# Patient Record
Sex: Male | Born: 1955 | Smoking: Never smoker
Health system: Northeastern US, Academic
[De-identification: ages and names within clinical notes are randomized; demographics above are authoritative.]

## PROBLEM LIST (undated history)

## (undated) ENCOUNTER — Emergency Department (HOSPITAL_BASED_OUTPATIENT_CLINIC_OR_DEPARTMENT_OTHER): Admission: EM | Payer: Medicare Other

## (undated) DIAGNOSIS — E785 Hyperlipidemia, unspecified: Secondary | ICD-10-CM

## (undated) DIAGNOSIS — I219 Acute myocardial infarction, unspecified: Secondary | ICD-10-CM

## (undated) DIAGNOSIS — C9200 Acute myeloblastic leukemia, not having achieved remission: Secondary | ICD-10-CM

## (undated) DIAGNOSIS — I509 Heart failure, unspecified: Secondary | ICD-10-CM

## (undated) DIAGNOSIS — I10 Essential (primary) hypertension: Secondary | ICD-10-CM

## (undated) HISTORY — DX: Hyperlipidemia, unspecified: E78.5

## (undated) HISTORY — DX: Acute myeloblastic leukemia, not having achieved remission: C92.00

## (undated) HISTORY — DX: Essential (primary) hypertension: I10

## (undated) HISTORY — DX: Heart failure, unspecified: I50.9

## (undated) HISTORY — DX: Acute myocardial infarction, unspecified: I21.9

## (undated) HISTORY — PX: NO PAST SURGERIES: SHX2092

---

## 2000-03-06 ENCOUNTER — Ambulatory Visit (HOSPITAL_COMMUNITY): Admission: RE | Admit: 2000-03-06 | Discharge: 2000-03-06 | Payer: Self-pay | Admitting: Urology

## 2000-03-09 ENCOUNTER — Emergency Department (HOSPITAL_COMMUNITY): Admission: EM | Admit: 2000-03-09 | Discharge: 2000-03-09 | Payer: Self-pay | Admitting: Urology

## 2000-03-09 ENCOUNTER — Encounter: Payer: Self-pay | Admitting: Urology

## 2000-03-12 ENCOUNTER — Encounter: Admission: RE | Admit: 2000-03-12 | Discharge: 2000-03-12 | Payer: Self-pay | Admitting: Urology

## 2000-03-12 ENCOUNTER — Encounter: Payer: Self-pay | Admitting: Urology

## 2001-02-20 ENCOUNTER — Emergency Department (HOSPITAL_COMMUNITY): Admission: EM | Admit: 2001-02-20 | Discharge: 2001-02-20 | Payer: Self-pay | Admitting: *Deleted

## 2011-11-19 DEATH — deceased

## 2014-06-16 ENCOUNTER — Encounter (HOSPITAL_BASED_OUTPATIENT_CLINIC_OR_DEPARTMENT_OTHER): Payer: Self-pay | Admitting: Emergency Medicine

## 2014-06-16 ENCOUNTER — Emergency Department (HOSPITAL_BASED_OUTPATIENT_CLINIC_OR_DEPARTMENT_OTHER)
Admission: EM | Admit: 2014-06-16 | Discharge: 2014-06-16 | Disposition: A | Discharge status: Home / Self Care | Payer: 59 | Attending: Emergency Medicine | Admitting: Emergency Medicine

## 2014-06-16 DIAGNOSIS — Y929 Unspecified place or not applicable: Secondary | ICD-10-CM | POA: Diagnosis not present

## 2014-06-16 DIAGNOSIS — W458XXA Other foreign body or object entering through skin, initial encounter: Secondary | ICD-10-CM | POA: Insufficient documentation

## 2014-06-16 DIAGNOSIS — Y939 Activity, unspecified: Secondary | ICD-10-CM | POA: Diagnosis not present

## 2014-06-16 DIAGNOSIS — S61214A Laceration without foreign body of right ring finger without damage to nail, initial encounter: Secondary | ICD-10-CM

## 2014-06-16 DIAGNOSIS — Z23 Encounter for immunization: Secondary | ICD-10-CM | POA: Diagnosis not present

## 2014-06-16 MED ORDER — LIDOCAINE HCL 2 % IJ SOLN
5.0000 mL | Freq: Once | INTRAMUSCULAR | Status: AC
  Administered 2014-06-16: 100 mg via INTRADERMAL
  Filled 2014-06-16: qty 20

## 2014-06-16 MED ORDER — TETANUS-DIPHTH-ACELL PERTUSSIS 5-2.5-18.5 LF-MCG/0.5 IM SUSP
0.5000 mL | Freq: Once | INTRAMUSCULAR | Status: AC
  Administered 2014-06-16: 0.5 mL via INTRAMUSCULAR
  Filled 2014-06-16: qty 0.5

## 2014-06-16 NOTE — Discharge Instructions (Signed)

## 2014-06-16 NOTE — ED Provider Notes (Signed)
CSN: [id number]     Arrival date & time 06/16/14  1649 History   First MD Initiated Contact with Patient 06/16/14 1655     Chief Complaint  Patient presents with  . Extremity Laceration     (Consider location/radiation/quality/duration/timing/severity/associated sxs/prior Treatment) Patient is a 58 y.o. male presenting with skin laceration.  Laceration Location:  Hand Hand laceration location:  R finger Length (cm):  2 Depth:  Cutaneous Quality: straight   Bleeding: controlled   Time since incident:  3 hours Injury mechanism: caught finger on a saw that he was putting down. Pain details:    Quality:  Sharp   Severity:  Mild   Timing:  Constant Foreign body present:  No foreign bodies Worsened by:  Movement Tetanus status:  Unknown   History reviewed. No pertinent past medical history. History reviewed. No pertinent past surgical history. No family history on file. History  Substance Use Topics  . Smoking status: Never Smoker   . Smokeless tobacco: Not on file  . Alcohol Use: No    Review of Systems  All other systems reviewed and are negative.     Allergies  Review of patient's allergies indicates no known allergies.  Home Medications   Prior to Admission medications   Not on File   BP 115/68  Pulse 67  Temp(Src) 98 F (36.7 C) (Oral)  Resp 16  Ht 5\' 7"  (1.702 m)  Wt 170 lb (77.111 kg)  BMI 26.62 kg/m2  SpO2 97% Physical Exam  Nursing note and vitals reviewed. Constitutional: He is oriented to person, place, and time. He appears well-developed and well-nourished. No distress.  HENT:  Head: Normocephalic and atraumatic.  Eyes: Conjunctivae are normal. No scleral icterus.  Neck: Neck supple.  Cardiovascular: Normal rate and intact distal pulses.   Pulmonary/Chest: Effort normal. No stridor. No respiratory distress.  Abdominal: Normal appearance. He exhibits no distension.  Neurological: He is alert and oriented to person, place, and time.  Skin:  Skin is warm and dry. No rash noted.  Laceration at proximal aspect of PIP joint of dorsal surface of right ring finger.  Bleeding controlled.  No apparent tendon involvement either visually or functionally.  Good distal perfusion, strength, sensation.    Psychiatric: He has a normal mood and affect. His behavior is normal.    ED Course  LACERATION REPAIR Date/Time: 06/16/2014 5:44 PM Performed by: Warnell ForesterWOFFORD, TREY Authorized by: Warnell ForesterWOFFORD, TREY Consent: Verbal consent obtained. Risks and benefits: risks, benefits and alternatives were discussed Consent given by: patient Body area: upper extremity Location details: right ring finger Laceration length: 2 cm Foreign bodies: no foreign bodies Tendon involvement: none Nerve involvement: none Vascular damage: no Anesthesia: digital block Local anesthetic: lidocaine 2% without epinephrine Anesthetic total: 4 ml Preparation: Patient was prepped and draped in the usual sterile fashion. Irrigation solution: saline Irrigation method: jet lavage Amount of cleaning: extensive Debridement: none Degree of undermining: none Skin closure: 5-0 Prolene Number of sutures: 3 Technique: simple Approximation: close Approximation difficulty: simple Dressing: antibiotic ointment Patient tolerance: Patient tolerated the procedure well with no immediate complications.   (including critical care time) Labs Review Labs Reviewed - No data to display  Imaging Review No results found.   EKG Interpretation None      MDM   Final diagnoses:  Laceration of right ring finger w/o foreign body w/o damage to nail, initial encounter    Simple, uncomplicated lac of right 4th finger.  Repaired.  Tetanus updated.    Thurston Poundsrey  Lilianna Case, MD 06/16/14 1745

## 2014-06-16 NOTE — ED Notes (Addendum)
MD at the bedside to suture with this RN

## 2014-06-16 NOTE — ED Notes (Addendum)
Cut right ring finger on saw approx 230pm-lac noted-bleeding controlled

## 2016-03-09 ENCOUNTER — Encounter (HOSPITAL_BASED_OUTPATIENT_CLINIC_OR_DEPARTMENT_OTHER): Payer: Self-pay

## 2016-03-09 ENCOUNTER — Emergency Department (HOSPITAL_BASED_OUTPATIENT_CLINIC_OR_DEPARTMENT_OTHER): Payer: Commercial Managed Care - HMO

## 2016-03-09 ENCOUNTER — Emergency Department (HOSPITAL_BASED_OUTPATIENT_CLINIC_OR_DEPARTMENT_OTHER)
Admission: EM | Admit: 2016-03-09 | Discharge: 2016-03-09 | Disposition: A | Discharge status: Home / Self Care | Payer: Commercial Managed Care - HMO | Attending: Emergency Medicine | Admitting: Emergency Medicine

## 2016-03-09 DIAGNOSIS — Y9389 Activity, other specified: Secondary | ICD-10-CM | POA: Diagnosis not present

## 2016-03-09 DIAGNOSIS — Y92009 Unspecified place in unspecified non-institutional (private) residence as the place of occurrence of the external cause: Secondary | ICD-10-CM | POA: Insufficient documentation

## 2016-03-09 DIAGNOSIS — S61213A Laceration without foreign body of left middle finger without damage to nail, initial encounter: Secondary | ICD-10-CM | POA: Diagnosis not present

## 2016-03-09 DIAGNOSIS — W270XXA Contact with workbench tool, initial encounter: Secondary | ICD-10-CM | POA: Insufficient documentation

## 2016-03-09 DIAGNOSIS — Y999 Unspecified external cause status: Secondary | ICD-10-CM | POA: Diagnosis not present

## 2016-03-09 DIAGNOSIS — Z792 Long term (current) use of antibiotics: Secondary | ICD-10-CM | POA: Insufficient documentation

## 2016-03-09 DIAGNOSIS — S61219A Laceration without foreign body of unspecified finger without damage to nail, initial encounter: Secondary | ICD-10-CM

## 2016-03-09 MED ORDER — LIDOCAINE-EPINEPHRINE (PF) 2 %-1:200000 IJ SOLN
INTRAMUSCULAR | Status: AC
  Filled 2016-03-09: qty 20

## 2016-03-09 MED ORDER — LIDOCAINE-EPINEPHRINE (PF) 2 %-1:200000 IJ SOLN
10.0000 mL | Freq: Once | INTRAMUSCULAR | Status: AC
  Administered 2016-03-09: 17:00:00

## 2016-03-09 NOTE — ED Notes (Signed)
Patient transported to X-ray 

## 2016-03-09 NOTE — ED Notes (Signed)
Saw vs left middle finger just prior to arrival-NAD-steady gait

## 2016-03-09 NOTE — Discharge Instructions (Signed)
Leave your dressing on for 24 hours. At this time tomorrow, wash your wound with warm soapy water, dry, apply antibiotic ointment and clean dressing. Do this daily for 1 week until you have your sutures removed in 7 days. You can return here or see your primary care provider for suture removal. Be aware of signs of infection which include fever, increasing pain, redness, drainage, streaking up your hand and arm. Please call your doctor or return to emergency department if he develop any of the symptoms. Please return to emergency department if you develop any new or worsening symptoms.   Laceration Care, Adult A laceration is a cut that goes through all of the layers of the skin and into the tissue that is right under the skin. Some lacerations heal on their own. Others need to be closed with stitches (sutures), staples, skin adhesive strips, or skin glue. Proper laceration care minimizes the risk of infection and helps the laceration to heal better. HOW TO CARE FOR YOUR LACERATION If sutures or staples were used:  Keep the wound clean and dry.  If you were given a bandage (dressing), you should change it at least one time per day or as told by your health care provider. You should also change it if it becomes wet or dirty.  Keep the wound completely dry for the first 24 hours or as told by your health care provider. After that time, you may shower or bathe. However, make sure that the wound is not soaked in water until after the sutures or staples have been removed.  Clean the wound one time each day or as told by your health care provider:  Wash the wound with soap and water.  Rinse the wound with water to remove all soap.  Pat the wound dry with a clean towel. Do not rub the wound.  After cleaning the wound, apply a thin layer of antibiotic ointmentas told by your health care provider. This will help to prevent infection and keep the dressing from sticking to the wound.  Have the sutures  or staples removed as told by your health care provider. If skin adhesive strips were used:  Keep the wound clean and dry.  If you were given a bandage (dressing), you should change it at least one time per day or as told by your health care provider. You should also change it if it becomes dirty or wet.  Do not get the skin adhesive strips wet. You may shower or bathe, but be careful to keep the wound dry.  If the wound gets wet, pat it dry with a clean towel. Do not rub the wound.  Skin adhesive strips fall off on their own. You may trim the strips as the wound heals. Do not remove skin adhesive strips that are still stuck to the wound. They will fall off in time. If skin glue was used:  Try to keep the wound dry, but you may briefly wet it in the shower or bath. Do not soak the wound in water, such as by swimming.  After you have showered or bathed, gently pat the wound dry with a clean towel. Do not rub the wound.  Do not do any activities that will make you sweat heavily until the skin glue has fallen off on its own.  Do not apply liquid, cream, or ointment medicine to the wound while the skin glue is in place. Using those may loosen the film before the wound has healed.  If you were given a bandage (dressing), you should change it at least one time per day or as told by your health care provider. You should also change it if it becomes dirty or wet.  If a dressing is placed over the wound, be careful not to apply tape directly over the skin glue. Doing that may cause the glue to be pulled off before the wound has healed.  Do not pick at the glue. The skin glue usually remains in place for 5-10 days, then it falls off of the skin. General Instructions  Take over-the-counter and prescription medicines only as told by your health care provider.  If you were prescribed an antibiotic medicine or ointment, take or apply it as told by your doctor. Do not stop using it even if your  condition improves.  To help prevent scarring, make sure to cover your wound with sunscreen whenever you are outside after stitches are removed, after adhesive strips are removed, or when glue remains in place and the wound is healed. Make sure to wear a sunscreen of at least 30 SPF.  Do not scratch or pick at the wound.  Keep all follow-up visits as told by your health care provider. This is important.  Check your wound every day for signs of infection. Watch for:  Redness, swelling, or pain.  Fluid, blood, or pus.  Raise (elevate) the injured area above the level of your heart while you are sitting or lying down, if possible. SEEK MEDICAL CARE IF:  You received a tetanus shot and you have swelling, severe pain, redness, or bleeding at the injection site.  You have a fever.  A wound that was closed breaks open.  You notice a bad smell coming from your wound or your dressing.  You notice something coming out of the wound, such as wood or glass.  Your pain is not controlled with medicine.  You have increased redness, swelling, or pain at the site of your wound.  You have fluid, blood, or pus coming from your wound.  You notice a change in the color of your skin near your wound.  You need to change the dressing frequently due to fluid, blood, or pus draining from the wound.  You develop a new rash.  You develop numbness around the wound. SEEK IMMEDIATE MEDICAL CARE IF:  You develop severe swelling around the wound.  Your pain suddenly increases and is severe.  You develop painful lumps near the wound or on skin that is anywhere on your body.  You have a red streak going away from your wound.  The wound is on your hand or foot and you cannot properly move a finger or toe.  The wound is on your hand or foot and you notice that your fingers or toes look pale or bluish.   This information is not intended to replace advice given to you by your health care provider. Make  sure you discuss any questions you have with your health care provider.   Document Released: 08/06/2005 Document Revised: 12/21/2014 Document Reviewed: 08/02/2014 Elsevier Interactive Patient Education [employer]2016 [employer]

## 2016-03-09 NOTE — ED Notes (Signed)
PA at bedside with suture cart at this time.

## 2016-03-09 NOTE — ED Provider Notes (Signed)
CSN: [id number]     Arrival date & time 03/09/16  1624 History  By signing my name below, I, Andre Zimmerman, attest that this documentation has been prepared under the direction and in the presence of non-physician practitioner, Buel Ream, PA-C. Electronically Signed: Linna Zimmerman, Scribe. 03/09/2016. 4:39 PM.    Chief Complaint  Patient presents with  . Finger Injury    The history is provided by the patient. No language interpreter was used.     HPI Comments: Andre Zimmerman is a 60 y.o. male who presents to the Emergency Department complaining of left middle finger laceration sustained 15 minutes PTA. Pt reports that he was using a chop saw at home and cut his left middle finger; he states the saw struck the bone of his left middle finger. Pt endorses mild pain and some tingling in the area currently. Pt reports he applied pressure to the laceration immediately afterwards and notes he did not experience much bleeding. He states he is UTD for tetanus. He denies chest pain, SOB, other pain, or any other associated symptoms.  History reviewed. No pertinent past medical history. History reviewed. No pertinent past surgical history. No family history on file. Social History  Substance Use Topics  . Smoking status: Never Smoker   . Smokeless tobacco: None  . Alcohol Use: No    Review of Systems  Respiratory: Negative for shortness of breath.   Cardiovascular: Negative for chest pain.  Musculoskeletal: Positive for arthralgias (left middle finger).  Skin: Positive for wound (left middle finger laceration). Negative for rash.  Neurological: Positive for numbness (left middle finger).  Psychiatric/Behavioral: The patient is not nervous/anxious.     Allergies  Review of patient's allergies indicates no known allergies.  Home Medications   Prior to Admission medications   Medication Sig Start Date End Date Taking? Authorizing Provider  Amoxicillin (AMOXIL PO) Take by mouth.   Yes  Historical Provider, MD   BP 134/86 mmHg  Pulse 54  Temp(Src) 98.8 F (37.1 C) (Oral)  Resp 16  Ht  (1.727 m)  Wt 68.04 kg  BMI 22.81 kg/m2  SpO2 99% Physical Exam  Constitutional: He appears well-developed and well-nourished. No distress.  HENT:  Head: Normocephalic and atraumatic.  Mouth/Throat: Oropharynx is clear and moist. No oropharyngeal exudate.  Eyes: Conjunctivae are normal. Pupils are equal, round, and reactive to light. Right eye exhibits no discharge. Left eye exhibits no discharge. No scleral icterus.  Neck: Normal range of motion. Neck supple. No thyromegaly present.  Cardiovascular: Normal rate, regular rhythm, normal heart sounds and intact distal pulses.  Exam reveals no gallop and no friction rub.   No murmur heard. Pulmonary/Chest: Effort normal and breath sounds normal. No stridor. No respiratory distress. He has no wheezes. He has no rales.  Abdominal: Soft. Bowel sounds are normal. He exhibits no distension. There is no tenderness. There is no rebound and no guarding.  Musculoskeletal: He exhibits no edema.  Left long finger: flexion and extension at DIP and PIP. Thumb-finger opposition intact. Abduction and adduction intact to all fingers. Pt able to make fist with left hand. Left long finger: 1.5 cm laceration to palmar aspect distal to DIP with macerated tissue throughout; no disruption to the nail bed, however small laceration to distal tip of the nail Normal sensation to the entire left long finger, cap refill <2secs  Lymphadenopathy:    He has no cervical adenopathy.  Neurological: He is alert. Coordination normal.  Normal sensation to all  fingers.  Skin: Skin is warm and dry. No rash noted. He is not diaphoretic. No pallor.  Psychiatric: He has a normal mood and affect.  Nursing note and vitals reviewed.   ED Course  .[name].Laceration Repair Date/Time: 03/09/2016 6:59 PM Performed by: [name], [name] Authorized by: [name], [name] Consent:  Verbal consent obtained. Consent given by: patient Patient identity confirmed: verbally with patient Body area: upper extremity Location details: left long finger Laceration length: 1.5 cm Foreign bodies: no foreign bodies Tendon involvement: none Nerve involvement: none Vascular damage: no Anesthesia: digital block Local anesthetic: lidocaine 2% with epinephrine Anesthetic total: 4 ml Patient sedated: no Irrigation solution: sterile water. Irrigation method: syringe Amount of cleaning: standard Debridement: minimal Degree of undermining: none Skin closure: Ethilon (4-0) Number of sutures: 4 Technique: simple Approximation: close Approximation difficulty: complex (due to maceration) Dressing: antibiotic ointment (iodoform gauze dressing) Patient tolerance: Patient tolerated the procedure well with no immediate complications   (including critical care time)  DIAGNOSTIC STUDIES: Oxygen Saturation is 100% on RA, normal by my interpretation.    COORDINATION OF CARE: 4:44 PM Discussed treatment plan with pt at bedside and pt agreed to plan.  Labs Review Labs Reviewed - No data to display  Imaging Review Dg Finger Middle Left  03/09/2016  CLINICAL DATA:  Laceration to finger tip. EXAM: LEFT MIDDLE FINGER 2+V COMPARISON:  None. FINDINGS: A soft laceration is evident within the soft tissues of the distal long finger. This is present on the ventral and medial aspect, but separate from the nail bed. There is no underlying fracture. The joints are intact. No radiopaque foreign body is present. IMPRESSION: Soft tissue laceration to the distal middle finger without underlying fracture or radiopaque foreign body. Electronically Signed   By: [name]  Mattern M.D.   On: 03/09/2016 16:45   I have personally reviewed and evaluated these images and lab results as part of my medical decision-making.   EKG Interpretation None      MDM   X-ray of left long finger shows soft tissue  laceration to the distal middle finger without underlying fracture or foreign body. Tetanus UTD. Laceration occurred < 12 hours prior to repair. 4 sutures placed and macerated tissue left unsutured with good control of bleeding. No foreign bodies, bone, or tendon disruption noted in a bloodless field. Discussed laceration care with pt and answered questions. Pt to f-u for suture removal in 7 days and wound check sooner should there be signs of dehiscence or infection. Patient understands and agrees with plan. Patient vitals stable throughout ED course and discharged in satisfactory condition.   Final diagnoses:  Laceration of finger of left hand, initial encounter    I personally performed the services described in this documentation, which was scribed in my presence. The recorded information has been reviewed and is accurate.   Emi HolesAlexandra M Kasara Schomer, PA-C 03/09/16 1902  Emi HolesAlexandra M Rome Schlauch, PA-C 03/09/16 1920  Rolan BuccoMelanie Belfi, MD 03/09/16 [phone]

## 2016-04-19 HISTORY — PX: OTHER SURGICAL HISTORY: SHX169

## 2017-08-22 IMAGING — CR DG FINGER MIDDLE 2+V*L*
3 series · 3 of 3 positions shown · non-contrast
Comparison: None.

CLINICAL DATA: Laceration to finger tip.

EXAM:
LEFT MIDDLE FINGER 2+V

[x finger pa left]
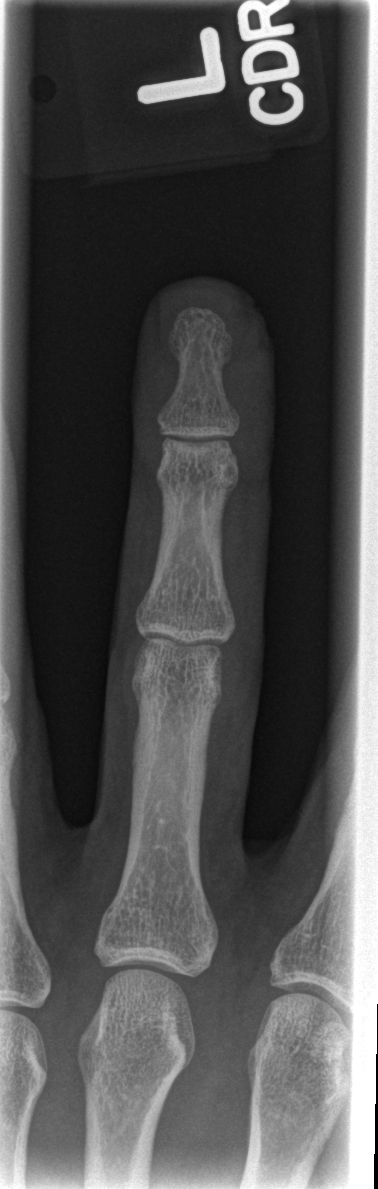

[x finger obl. left]
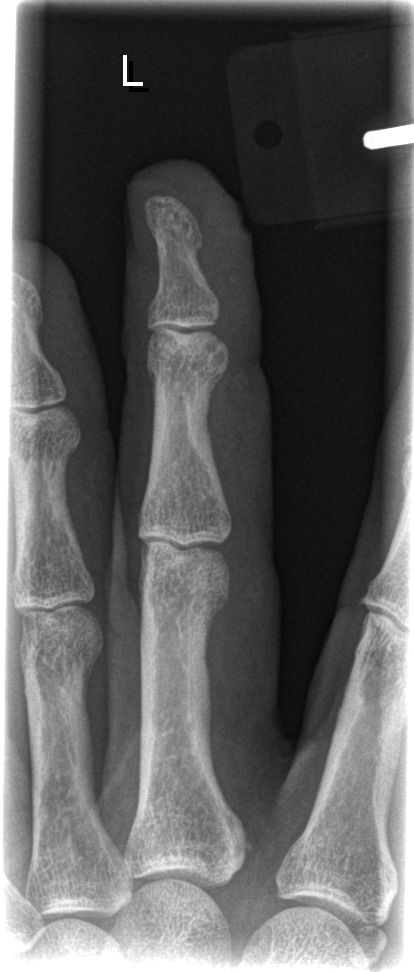

[x finger lateral left]
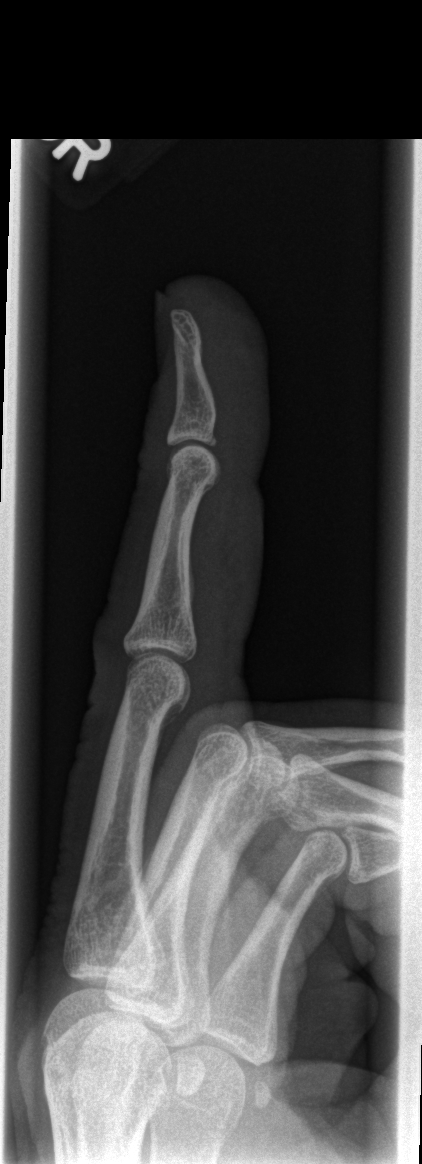

[3 of 3 positions shown; findings below may reference images not displayed]

FINDINGS: A soft laceration is evident within the soft tissues of the distal
long finger. This is present on the ventral and medial aspect, but
separate from the nail bed. There is no underlying fracture. The
joints are intact. No radiopaque foreign body is present.
IMPRESSION: Soft tissue laceration to the distal middle finger without
underlying fracture or radiopaque foreign body.

## 2017-11-26 ENCOUNTER — Ambulatory Visit: Payer: 59 | Admitting: Cardiology

## 2017-11-26 ENCOUNTER — Encounter: Payer: Self-pay | Admitting: Cardiology

## 2017-11-26 ENCOUNTER — Encounter: Payer: Self-pay | Admitting: *Deleted

## 2017-11-26 DIAGNOSIS — E785 Hyperlipidemia, unspecified: Secondary | ICD-10-CM | POA: Diagnosis not present

## 2017-11-26 DIAGNOSIS — I2510 Atherosclerotic heart disease of native coronary artery without angina pectoris: Secondary | ICD-10-CM | POA: Insufficient documentation

## 2017-11-26 DIAGNOSIS — R0609 Other forms of dyspnea: Secondary | ICD-10-CM

## 2017-11-26 MED ORDER — ASPIRIN EC 81 MG PO TBEC: 81.0000 mg | DELAYED_RELEASE_TABLET | Freq: Every day | ORAL | 3 refills | Status: DC

## 2017-11-26 NOTE — Patient Instructions (Signed)
Medication Instructions:  Your physician has recommended you make the following change in your medication:  START aspirin 81 mg daily  Labwork: None today. You will have a lipid panel done [facility]. Please fast before lab work.   Testing/Procedures: You had an EKG today.  Your physician has requested that you have an echocardiogram. Echocardiography is a painless test that uses sound waves to create images of your heart. It provides your doctor with information about the size and shape of your heart and how well your heart's chambers and valves are working. This procedure takes approximately one hour. There are no restrictions for this procedure.  Your physician has requested that you have en exercise stress myoview. For further information please visit [email or web address]. Please follow instruction sheet, as given.   Follow-Up: Your physician recommends that you schedule a follow-up appointment in: 1 month  Any Other Special Instructions Will Be Listed Below (If Applicable).     If you need a refill on your cardiac medications before your next appointment, please call your pharmacy.

## 2017-11-26 NOTE — Addendum Note (Signed)
Addended by: Crist FatLOCKHART, Shelden Raborn P on: 11/26/2017 11:06 AM   Modules accepted: Orders

## 2017-11-26 NOTE — Progress Notes (Signed)
Cardiology Consultation:    Date:  11/26/2017   ID:  Andre Zimmerman, DOB 21-Jun-1956, MRN [id number]  PCP:  Daisy Floro, MD  Cardiologist:  Gypsy Balsam, MD   Referring MD: Daisy Floro, MD   Chief Complaint  Patient presents with  . Fatigue  . Dizziness  . Generalized Body Aches  And weak and tired  History of Present Illness:    Andre Zimmerman is a 62 y.o. male who is being seen today for the evaluation of weakness and fatigue at the request of Daisy Floro, MD.  Apparently more than 10 years ago he suffered from myocardial infarction.  He was seen by 1 of our partner he did have echocardiogram as well as stress test some medication were given to him however he was told that everything is fine he did not have any follow-up after that.  Since last winter he complained of being weak tired exhausted.  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  There is no chest pain tightness squeezing pressure burning chest chest fatigue tiredness and short of breath.  Also walking or running he will get more short of breath than before.  Past Medical History:  Diagnosis Date  . Heart attack (HCC)   . Hyperlipidemia     Past Surgical History:  Procedure Laterality Date  . NO PAST SURGERIES      Current Medications: Current Meds  Medication Sig  . ACAI PO Take 1 capsule by mouth daily.  [name] glucosamine-chondroitin 500-400 MG tablet Take 1 tablet by mouth 3 (three) times daily.  . Multiple Vitamin (MULTIVITAMIN) capsule Take 1 capsule by mouth daily.  . Red Yeast Rice Extract (RED YEAST RICE PO) Take 1 capsule by mouth daily.     Allergies:   Patient has no known allergies.   Social History   Socioeconomic History  . Marital status: Divorced    Spouse name: Not on file  . Number of children: Not on file  . Years of education: Not on file  . Highest education level: Not on file  Occupational History    . Not on file  Social Needs  . Financial resource strain: Not on file  . Food insecurity:    Worry: Not on file    Inability: Not on file  . Transportation needs:    Medical: Not on file    Non-medical: Not on file  Tobacco Use  . Smoking status: Never Smoker  . Smokeless tobacco: Never Used  Substance and Sexual Activity  . Alcohol use: Yes    Comment: rare  . Drug use: No  . Sexual activity: Not on file  Lifestyle  . Physical activity:    Days per week: Not on file    Minutes per session: Not on file  . Stress: Not on file  Relationships  . Social connections:    Talks on phone: Not on file    Gets together: Not on file    Attends religious service: Not on file    Active member of club or organization: Not on file    Attends meetings of clubs or organizations: Not on file    Relationship status: Not on file  Other Topics Concern  . Not on file  Social History Narrative  . Not on file     Family History: The patient's family history includes Cancer in his mother;  Diabetes in his mother; Heart disease in his father. ROS:   Please see the history of present illness.    All 14 point review of systems negative except as described per history of present illness.  EKGs/Labs/Other Studies Reviewed:    The following studies were reviewed today:   EKG:  EKG is  ordered today.  The ekg ordered today demonstrates sinus bradycardia rate of 59.  Q waves inferiorly as well as poor progression anterior precordium raising suspicion for old inferior as well as anterior wall microinfarction  Recent Labs: No results found for requested labs within last 8760 hours.  Recent Lipid Panel No results found for: CHOL, TRIG, HDL, CHOLHDL, VLDL, LDLCALC, LDLDIRECT  Physical Exam:    VS:  BP 110/76 (BP Location: Left Arm)   Pulse 60   Ht 5\' 7"  (1.702 m)   Wt 179 lb 12.8 oz (81.6 kg)   SpO2 95%   BMI 28.16 kg/m     Wt Readings from Last 3 Encounters:  11/26/17 179 lb 12.8 oz  (81.6 kg)  03/09/16 150 lb (68 kg)  06/16/14 170 lb (77.1 kg)     GEN:  Well nourished, well developed in no acute distress HEENT: Normal NECK: No JVD; No carotid bruits LYMPHATICS: No lymphadenopathy CARDIAC: RRR, no murmurs, no rubs, no gallops RESPIRATORY:  Clear to auscultation without rales, wheezing or rhonchi  ABDOMEN: Soft, non-tender, non-distended MUSCULOSKELETAL:  No edema; No deformity  SKIN: Warm and dry NEUROLOGIC:  Alert and oriented x 3 PSYCHIATRIC:  Normal affect   ASSESSMENT:    1. Coronary artery disease involving native coronary artery of native heart without angina pectoris   2. Dyslipidemia   3. Dyspnea on exertion    PLAN:    In order of problems listed above:  1. Coronary artery disease with myocardial infarction more than 10 years ago.  I will ask him to have echocardiogram to assess left ventricular ejection fraction.  He also will be scheduled to have exercise Cardiolite to rule out any ischemia.  He does not have any typical symptoms but with his EKG and past medical history of myocardial infarction I want to make sure there is no significant inducible ischemia.  His exertional shortness of breath could be an angina equivalent.  In the meantime I start giving him one baby aspirin every single day he does not want to take an additional medication until he will have some more data about his heart.  He told me right away that he is not a medicine person. 2. Dyslipidemia: I will try to pull out his all cholesterol which was done in October also when he will come here for stress test and echocardiogram will get fasting lipid profile.  In my opinion he must be on high intensity statin and that is what I told him he is reluctant to start that medication he said he takes red yeast rice.  I told him that is probably not enough.  We will continue this discussion in the future. 3. Dyspnea on exertion: Multifactorial however definitely we need to check his left  ventricular ejection fraction which we will do by doing echocardiogram as well as rule out ischemia with stress testing.  Overall as he is a gentleman with what appears to be coronary artery disease.  Neck left that his care for years however now it is time to put him back on good medication and hopefully he will do well.  Told him not to exercise aggressively until  we do stress test and echocardiogram.   Medication Adjustments/Labs and Tests Ordered: Current medicines are reviewed at length with the patient today.  Concerns regarding medicines are outlined above.  No orders of the defined types were placed in this encounter.  No orders of the defined types were placed in this encounter.   Signed, Georgeanna Leaobert J. Krasowski, MD, [facility]. 11/26/2017 9:38 AM    [facility]

## 2017-12-04 ENCOUNTER — Telehealth (HOSPITAL_COMMUNITY): Payer: Self-pay | Admitting: *Deleted

## 2017-12-04 NOTE — Telephone Encounter (Signed)
Left message on voicemail per DPR in reference to upcoming appointment scheduled on 12/10/17 with detailed instructions given per Myocardial Perfusion Study Information Sheet for the test. LM to arrive 15 minutes early, and that it is imperative to arrive on time for appointment to keep from having the test rescheduled. If you need to cancel or reschedule your appointment, please call the office within 24 hours of your appointment. Failure to do so may result in a cancellation of your appointment, and a $50 no show fee. Phone number given for call back for any questions. Kiel, Maycel Riffe Jacqueline    

## 2017-12-10 ENCOUNTER — Ambulatory Visit (HOSPITAL_BASED_OUTPATIENT_CLINIC_OR_DEPARTMENT_OTHER): Payer: 59

## 2017-12-10 ENCOUNTER — Ambulatory Visit (HOSPITAL_COMMUNITY): Payer: 59 | Attending: Cardiology

## 2017-12-10 ENCOUNTER — Other Ambulatory Visit: Payer: Self-pay

## 2017-12-10 VITALS — Ht 67.0 in | Wt 179.0 lb

## 2017-12-10 DIAGNOSIS — I252 Old myocardial infarction: Secondary | ICD-10-CM | POA: Insufficient documentation

## 2017-12-10 DIAGNOSIS — R0609 Other forms of dyspnea: Secondary | ICD-10-CM

## 2017-12-10 DIAGNOSIS — I2510 Atherosclerotic heart disease of native coronary artery without angina pectoris: Secondary | ICD-10-CM | POA: Insufficient documentation

## 2017-12-10 DIAGNOSIS — I517 Cardiomegaly: Secondary | ICD-10-CM | POA: Diagnosis not present

## 2017-12-10 DIAGNOSIS — E785 Hyperlipidemia, unspecified: Secondary | ICD-10-CM

## 2017-12-10 LAB — MYOCARDIAL PERFUSION IMAGING
CHL CUP MPHR: 159 {beats}/min
CSEPHR: 94 %
Estimated workload: 14.3 METS
Exercise duration (min): 12 min
Exercise duration (sec): 30 s
LHR: 0.36
LVDIAVOL: 114 mL (ref 62–150)
LVSYSVOL: 46 mL
Peak HR: 150 {beats}/min
RPE: 18
Rest HR: 54 {beats}/min
SDS: 2
SRS: 4
SSS: 6
TID: 1.01

## 2017-12-10 LAB — ECHOCARDIOGRAM COMPLETE
Height: 67 in
Weight: 2864 oz

## 2017-12-10 MED ORDER — TECHNETIUM TC 99M TETROFOSMIN IV KIT
31.7000 | PACK | Freq: Once | INTRAVENOUS | Status: AC | PRN
  Administered 2017-12-10: 31.7 via INTRAVENOUS
  Filled 2017-12-10: qty 32

## 2017-12-10 MED ORDER — TECHNETIUM TC 99M TETROFOSMIN IV KIT
10.9000 | PACK | Freq: Once | INTRAVENOUS | Status: AC | PRN
  Administered 2017-12-10: 10.9 via INTRAVENOUS
  Filled 2017-12-10: qty 11

## 2017-12-26 ENCOUNTER — Encounter: Payer: Self-pay | Admitting: Cardiology

## 2017-12-26 ENCOUNTER — Ambulatory Visit: Payer: 59 | Admitting: Cardiology

## 2017-12-26 VITALS — BP 140/64 | HR 68 | Ht 67.0 in | Wt 180.0 lb

## 2017-12-26 DIAGNOSIS — I2510 Atherosclerotic heart disease of native coronary artery without angina pectoris: Secondary | ICD-10-CM

## 2017-12-26 DIAGNOSIS — E785 Hyperlipidemia, unspecified: Secondary | ICD-10-CM | POA: Diagnosis not present

## 2017-12-26 DIAGNOSIS — R0609 Other forms of dyspnea: Secondary | ICD-10-CM

## 2017-12-26 NOTE — Progress Notes (Signed)
Cardiology Office Note:    Date:  12/26/2017   ID:  HANFORD LUST, DOB 08-14-1956, MRN [id number]  PCP:  Daisy Floro, MD  Cardiologist:  Gypsy Balsam, MD    Referring MD: Daisy Floro, MD   Chief Complaint  Patient presents with  . 1 month follow up  Doing better  History of Present Illness:    Andre Zimmerman is a 62 y.o. male with some atypical symptoms of tiredness fatigue and shortness of breath.  He does have some remote history of some coronary issues he had a stress test which was negative for ischemia, he did have an echocardiogram which showed no significant abnormality.  He said that since that time he start taking aspirin he feels much better.  Still I think work-up need to continue.  We will out cardiac issues however I offer him to have routine blood test.  He does not have primary care physician I strongly recommended to see 1 in the meantime I will do some routine blood test.  Past Medical History:  Diagnosis Date  . Heart attack (HCC)   . Hyperlipidemia     Past Surgical History:  Procedure Laterality Date  . NO PAST SURGERIES      Current Medications: Current Meds  Medication Sig  . ACAI PO Take 1 capsule by mouth daily.  [name] aspirin EC 81 MG tablet Take 1 tablet (81 mg total) by mouth daily.  [name] glucosamine-chondroitin 500-400 MG tablet Take 1 tablet by mouth 3 (three) times daily.  . Multiple Vitamin (MULTIVITAMIN) capsule Take 1 capsule by mouth daily.  . Red Yeast Rice Extract (RED YEAST RICE PO) Take 1 capsule by mouth daily.     Allergies:   Patient has no known allergies.   Social History   Socioeconomic History  . Marital status: Divorced    Spouse name: Not on file  . Number of children: Not on file  . Years of education: Not on file  . Highest education level: Not on file  Occupational History  . Not on file  Social Needs  . Financial resource strain: Not on file  . Food insecurity:    Worry: Not on file    Inability: Not  on file  . Transportation needs:    Medical: Not on file    Non-medical: Not on file  Tobacco Use  . Smoking status: Never Smoker  . Smokeless tobacco: Never Used  Substance and Sexual Activity  . Alcohol use: Yes    Comment: rare  . Drug use: No  . Sexual activity: Not on file  Lifestyle  . Physical activity:    Days per week: Not on file    Minutes per session: Not on file  . Stress: Not on file  Relationships  . Social connections:    Talks on phone: Not on file    Gets together: Not on file    Attends religious service: Not on file    Active member of club or organization: Not on file    Attends meetings of clubs or organizations: Not on file    Relationship status: Not on file  Other Topics Concern  . Not on file  Social History Narrative  . Not on file     Family History: The patient's family history includes Cancer in his mother; Diabetes in his mother; Heart disease in his father. ROS:   Please see the history of present illness.    All 14 point review  of systems negative except as described per history of present illness  EKGs/Labs/Other Studies Reviewed:      Recent Labs: No results found for requested labs within last 8760 hours.  Recent Lipid Panel No results found for: CHOL, TRIG, HDL, CHOLHDL, VLDL, LDLCALC, LDLDIRECT  Physical Exam:    VS:  BP 140/64   Pulse 68   Ht  (1.702 m)   Wt 180 lb (81.6 kg)   SpO2 98%   BMI 28.19 kg/m     Wt Readings from Last 3 Encounters:  12/26/17 180 lb (81.6 kg)  12/10/17 179 lb (81.2 kg)  11/26/17 179 lb 12.8 oz (81.6 kg)     GEN:  Well nourished, well developed in no acute distress HEENT: Normal NECK: No JVD; No carotid bruits LYMPHATICS: No lymphadenopathy CARDIAC: RRR, no murmurs, no rubs, no gallops RESPIRATORY:  Clear to auscultation without rales, wheezing or rhonchi  ABDOMEN: Soft, non-tender, non-distended MUSCULOSKELETAL:  No edema; No deformity  SKIN: Warm and dry LOWER EXTREMITIES: no  swelling NEUROLOGIC:  Alert and oriented x 3 PSYCHIATRIC:  Normal affect   ASSESSMENT:    1. Coronary artery disease involving native coronary artery of native heart without angina pectoris   2. Dyslipidemia   3. Dyspnea on exertion    PLAN:    In order of problems listed above:  1. Coronary artery disease: Stable stress test negative.  Continue taking aspirin.  Awaiting cholesterol profile 2. Dyslipidemia: Apparently had cholesterol done but I do not see results of it we will try to look for it 3. Dyspnea on exertion.  Much better right now stress test negative echo negative.  I see him back in 3 months or sooner if he has a problem   Medication Adjustments/Labs and Tests Ordered: Current medicines are reviewed at length with the patient today.  Concerns regarding medicines are outlined above.  No orders of the defined types were placed in this encounter.  Medication changes: No orders of the defined types were placed in this encounter.   Signed, Georgeanna Lea, MD, [facility] 12/26/2017 10:15 AM    [facility]

## 2017-12-26 NOTE — Patient Instructions (Signed)
Medication Instructions:  Your physician recommends that you continue on your current medications as directed. Please refer to the Current Medication list given to you today.  Labwork: Your physician recommends that you have the following labs drawn: CMP, CBC, TSH, PSA, B12  Testing/Procedures: None  Follow-Up: Your physician wants you to follow-up in: 3 months. You will receive a reminder letter in the mail two months in advance. If you don't receive a letter, please call our office to schedule the follow-up appointment.  Any Other Special Instructions Will Be Listed Below (If Applicable).     If you need a refill on your cardiac medications before your next appointment, please call your pharmacy.

## 2017-12-27 ENCOUNTER — Telehealth: Payer: Self-pay | Admitting: *Deleted

## 2017-12-27 DIAGNOSIS — R5383 Other fatigue: Secondary | ICD-10-CM

## 2017-12-27 LAB — COMPREHENSIVE METABOLIC PANEL
ALT: 51 IU/L — AB (ref 0–44)
AST: 46 IU/L — AB (ref 0–40)
Albumin/Globulin Ratio: 1.8 (ref 1.2–2.2)
Albumin: 4.2 g/dL (ref 3.6–4.8)
Alkaline Phosphatase: 89 IU/L (ref 39–117)
BUN/Creatinine Ratio: 20 (ref 10–24)
BUN: 22 mg/dL (ref 8–27)
Bilirubin Total: 0.4 mg/dL (ref 0.0–1.2)
CALCIUM: 9.5 mg/dL (ref 8.6–10.2)
CO2: 24 mmol/L (ref 20–29)
CREATININE: 1.1 mg/dL (ref 0.76–1.27)
Chloride: 107 mmol/L — ABNORMAL HIGH (ref 96–106)
GFR calc Af Amer: 83 mL/min/{1.73_m2} (ref >59)
GFR, EST NON AFRICAN AMERICAN: 72 mL/min/{1.73_m2} (ref >59)
Globulin, Total: 2.4 g/dL (ref 1.5–4.5)
Glucose: 86 mg/dL (ref 65–99)
Potassium: 5 mmol/L (ref 3.5–5.2)
Sodium: 143 mmol/L (ref 134–144)
Total Protein: 6.6 g/dL (ref 6.0–8.5)

## 2017-12-27 LAB — PSA: Prostate Specific Ag, Serum: 1.8 ng/mL (ref 0.0–4.0)

## 2017-12-27 LAB — CBC
HEMOGLOBIN: 14.4 g/dL (ref 13.0–17.7)
Hematocrit: 43.8 % (ref 37.5–51.0)
MCH: 28.3 pg (ref 26.6–33.0)
MCHC: 32.9 g/dL (ref 31.5–35.7)
MCV: 86 fL (ref 79–97)
Platelets: 271 10*3/uL (ref 150–379)
RBC: 5.08 x10E6/uL (ref 4.14–5.80)
RDW: 14.1 % (ref 12.3–15.4)
WBC: 5.8 10*3/uL (ref 3.4–10.8)

## 2017-12-27 LAB — VITAMIN B12: VITAMIN B 12: 1116 pg/mL (ref 232–1245)

## 2017-12-27 LAB — TSH: TSH: 6.79 u[IU]/mL — AB (ref 0.450–4.500)

## 2017-12-27 NOTE — Telephone Encounter (Signed)
Patient advised of lab results and informed him that Dr. Bing Matter advised further blood work be done. Patient will come by our office one day next week to have thyroid panel with TSH drawn. No further questions.

## 2017-12-27 NOTE — Telephone Encounter (Signed)
-----   Message from Georgeanna Lea, MD sent at 12/27/2017  1:08 PM EDT ----- Hypothyroidism, rest ok, get thyroid profile

## 2017-12-28 LAB — THYROID PANEL WITH TSH
FREE THYROXINE INDEX: 1.6 (ref 1.2–4.9)
T3 UPTAKE RATIO: 27 % (ref 24–39)
T4, Total: 6.1 ug/dL (ref 4.5–12.0)
TSH: 6.56 u[IU]/mL — ABNORMAL HIGH (ref 0.450–4.500)

## 2017-12-31 ENCOUNTER — Other Ambulatory Visit: Payer: Self-pay

## 2017-12-31 DIAGNOSIS — E039 Hypothyroidism, unspecified: Secondary | ICD-10-CM

## 2017-12-31 MED ORDER — LEVOTHYROXINE SODIUM 25 MCG PO TABS: 25.0000 ug | ORAL_TABLET | Freq: Every day | ORAL | 0 refills | Status: DC

## 2018-02-10 ENCOUNTER — Ambulatory Visit: Payer: Commercial Managed Care - HMO | Admitting: Interventional Cardiology

## 2018-02-24 ENCOUNTER — Other Ambulatory Visit: Payer: Self-pay | Admitting: Cardiology

## 2018-02-25 LAB — TSH: TSH: 3.72 u[IU]/mL (ref 0.450–4.500)

## 2018-02-27 ENCOUNTER — Telehealth: Payer: Self-pay | Admitting: Cardiology

## 2018-02-27 MED ORDER — LEVOTHYROXINE SODIUM 25 MCG PO TABS: 25.0000 ug | ORAL_TABLET | Freq: Every day | ORAL | 2 refills | Status: DC

## 2018-02-27 NOTE — Telephone Encounter (Signed)
Patient is almost out  1. Which medications need to be refilled? (please list name of each medication and dose if known) levothyroxine   2. Which pharmacy/location (including street and city if local pharmacy) is medication to be sent to? [facility] [address] [place]  3. Do they need a 30 day or 90 day supply? 90 day

## 2018-02-27 NOTE — Telephone Encounter (Signed)
[place] [facility] [place]

## 2018-05-22 ENCOUNTER — Encounter: Payer: Self-pay | Admitting: Gastroenterology

## 2018-06-09 ENCOUNTER — Ambulatory Visit: Payer: BLUE CROSS/BLUE SHIELD | Attending: Ophthalmology | Admitting: Ophthalmology

## 2018-06-09 ENCOUNTER — Encounter: Payer: Self-pay | Admitting: Ophthalmology

## 2018-06-09 ENCOUNTER — Other Ambulatory Visit: Payer: Self-pay | Admitting: Ophthalmology

## 2018-06-09 DIAGNOSIS — H02886 Meibomian gland dysfunction of left eye, unspecified eyelid: Secondary | ICD-10-CM | POA: Insufficient documentation

## 2018-06-09 DIAGNOSIS — H04129 Dry eye syndrome of unspecified lacrimal gland: Secondary | ICD-10-CM | POA: Insufficient documentation

## 2018-06-09 DIAGNOSIS — H02889 Meibomian gland dysfunction of unspecified eye, unspecified eyelid: Secondary | ICD-10-CM

## 2018-06-09 DIAGNOSIS — H16239 Neurotrophic keratoconjunctivitis, unspecified eye: Secondary | ICD-10-CM

## 2018-06-09 DIAGNOSIS — D89813 Graft-versus-host disease, unspecified: Secondary | ICD-10-CM | POA: Insufficient documentation

## 2018-06-09 DIAGNOSIS — H168 Other keratitis: Secondary | ICD-10-CM | POA: Insufficient documentation

## 2018-06-09 MED ORDER — SODIUM CHLORIDE BACTERIOSTATIC 0.9 % IJ SOLN WRAPPED  *I*
3.0000 mL | Freq: Every day | INTRAMUSCULAR | Status: AC
Start: 2018-06-09 — End: 2018-08-08

## 2018-06-09 MED ORDER — TACROLIMUS 5 MG/ML IV SOLN *I*
1.0000 [drp] | Freq: Two times a day (BID) | INTRAVENOUS | 3 refills | Status: DC
Start: 2018-06-09 — End: 2018-09-30

## 2018-06-09 MED ORDER — MOXIFLOXACIN HCL 0.5 % OP SOLN *I*
1.0000 [drp] | Freq: Two times a day (BID) | OPHTHALMIC | 3 refills | Status: DC
Start: 2018-06-09 — End: 2018-10-18

## 2018-06-09 NOTE — Patient Instructions (Addendum)
-   Artificial tears - saline vials, Refresh Relieva PF  - Durezol twice a day left eye  - Vigamox twice a day left eye  - Tacrolimus 0.005% twice a day left eye  - Ocusoft Hypochlor to the lids, once or twice a day  - Plasma tears   - Oxervate 6 times a day  - Prolensa once a day as needed for pain, try not to use  - Proclear 8.6 contact lens

## 2018-06-09 NOTE — Procedures (Signed)
A the slit lamp, proparacaine was placed into the left eye. The old BCL was removed and a BioDOptix amniotic membrane graft was placed onto the left cornea. It was secured into positioned with wech sponges. A Proclear 8.6 BCL was placed on top of this and centered onto the cornea. The patient tolerated the procedure well.     Tissue ID: [id number]  Product ID: [id number]  Exp Date: 02/20/2023

## 2018-06-09 NOTE — Progress Notes (Signed)
Outpatient Visit      Patient name: Henry Ochoa  DOB: 1955-08-23       Age: 62 y.o.  MR#: [id number]    Encounter Date: 06/09/2018    Subjective:      Chief Complaint   Patient presents with    Other     Consult Keratits OS     HPI     Other      Additional comments: Consult Keratits OS              Comments     Henry Ochoa is a 62 y.o. male referred by Dr. Marina Goodell for Keratitis OS   concern for graft vs host. Dr. Marina Goodell inserted bio-disc at last exam   05/22/18  Patient notes OS is irritated and sore. Light sensitive OS. He started   using oxervate on 05/22/18 and notes a new sore sensation since then. He   has also just started using serum tears made by Dr. Marina Goodell in his office.    Occ Meds:  Medrol dose pack oral (finished 2 wks ago), Durezol BID OS, Prolensa BID   OS, Muro, Ofloxacin TID OS, Systane Ultra QHS, Serum tears           Last edited by Thomes Dinning on 06/09/2018  2:56 PM. (History)        has a current medication list which includes the following prescription(s): difluprednate, bromfenac sodium, ofloxacin, dextran 70-hypromellose, lisinopril, tramadol, diltiazem, magnesium, vitamin d, metoprolol, folic acid, apixaban, doxycycline monohydrate, furosemide, spironolactone, cenegermin-bkbj, sodium chloride, acyclovir, doxycycline hyclate, and methylprednisolone.     is allergic to meropenem.      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 (History)         Objective:     Base Eye Exam     Visual Acuity (Snellen - Linear)       Right Left    Dist cc 20/20 -1 20/400    Dist ph cc  NI          Tonometry (Tonopen, 3:07 PM)       Right Left    Pressure 14 16          Pupils       Shape APD    Right Round None    Left  None          Neuro/Psych     Oriented x3:  Yes    Mood/Affect:  Normal            Slit Lamp and Fundus Exam     External Exam       Right Left    External Normal ocular adnexae, lacrimal gland & drainage, orbits Normal ocular adnexae, lacrimal gland &  drainage, orbits          Slit Lamp Exam       Right Left    Lids/Lashes 2+ Meibomian gland dysfunction, 2+ Scurf, 2+ Sleeve (both upper and lower lids) 2+ Meibomian gland dysfunction, 2+ Scurf, 2+sleeve, mild to moderate (2+) subepithelial fibrosis inferiorly    Conjunctiva/Sclera Normal bulbar/palpebral, conjunctiva, sclera slight inferior subconjunctivial fibrosis, 1+upper palpebral conjunctiva  1+ Injection    Cornea 1+mucous tear film CL in place with good fit and centration, 1+mucous in tear film, approximately 2.75mm by 7mm epithelial defect without thinning with an area of anterior stromal opacity that extends 68mm in radius beyond epithelial defect    Anterior Chamber Clear & deep Clear & deep    Iris Normal shape, size, morphology Normal shape, size, morphology    Lens Normal cortex, nucleus, anterior/posterior capsule, clarity Normal cortex, nucleus, anterior/posterior capsule, clarity    Vitreous Clear Clear                        No annotated images are attached to the encounter.      Assessment/Plan:     1. Meibomian gland disease, unspecified laterality     2. 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 Neurotrophic keratitis          PLAN:    Henry Ochoa is a 62 y.o. male is here for an evaluation of his keratitis OS for possible GVHD    Referral: Dr. 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  Severity: moderate     Specific Grading:  Dry Eye: Aqueous / Water Deficient:  moderate    Dry Eye: Evaporative / Oil Deficient:  moderate    Dry Eye: Mucus Deficient / Filamentary Keratitis: mild    Inflammatory chronic ocular GVHD:  moderate    Treatment:  For this patient, the following regimen will be prescribed:    Artificial tears - saline vials, Refresh Relieva PF  Durezol twice a day left eye  Vigamox twice a day left eye  Prolensa once a day as needed for pain, try not to use  Tacrolimus 0.005% twice a day left eye  Ocusoft Hypochlor to the lids, once or twice a day  Oxervate 6 times a day  Proclear 8.6 contact lens - placed lens in left eye today to allow for better penetration of medications (low oxygen flow)  BioDOptix placed on the left eye beneath the lens    Will consider Azasite in the future. We will plan on having his blood drawn at next visit if possible. Need to use [facility] code to get the lens covered - They will contact [place] today      Follow up 3 weeks       ----------------------------------------------------------------------------------------------------------  In general, treatment options include general optimization of the ocular surface environment, treatment of concomitant conditions (such as aqueous deficient and evaporative dry eye, mucus abnormalities, and anatomic eyelid abnormalities), topical steroids for acute exacerbation and when required for chronic immunosuppression, low level topical immunosuppression such as Restasis or Shirley Friar,  enhanced topical immunosuppression such as high dose topical Cyclosporine or Tacrolimus, environmental optimization (moisture chamber occluders, humidifiers, etc), and artificial tears (either optimally  preserved or non-preserved). Ancillary therapy can include autologous serum tears, autologous serum tears with platelet lysate, sutured or glued amniotic membrane graft (AMG), or an AMG placed under a bandage contact lens (BioDOptix). Therapeutic contact lens and related options include a hydrogel contact lens, silicone/hydrogel bandage contact lens, scleral lens, or ocular surface device (PROSE).  Systemic immunosuppression or increasing systemic immunosuppression may be utilized.        --------------------------------------------------------------------------------------------------------------------------------  --------------------------------------------------------------------------------------------------------------------------------    There are several grading scales, each with advantages and disadvantages. The NIH scale is fairly simple.  The  [place] consensus conference scale I believe is more useful in guiding treatment as it specifically addresses ocular inflamation.  Clinical scoring of ocular cGVHD according to the NIH consensus     Score 0: no symptom.     Score 1: mild symptoms of dry eye or asymptomatic signs of KCS     Score 2: moderate dry eye symptoms partially affecting activities of daily living, requiring drops >3 times per day or punctual plugs, without vision impairment     Score 3: severe dry eye symptoms significantly affecting activities of daily living or unable to work because of ocular symptoms or loss of vision because of [place]      Annals of Hematology, February 2016, Volume 95, Issue 3  Verification of the new grading scale for ocular chronic graft-versus-host disease developed by the [place]iss consensus conference on chronic GVHD  Grading according to Consensus Conference Proposal for Staging and Documentation Criteria for Ocular cGVHD: all patients in the cohort had (ongoing) inflammatory activity, 85 % of patients suffered from functional  impairment due to ocular cGVHD   Involvement of different ocular tissue  (a) Extent of lacrimal gland dysfunction 100 % (n?=?66)     (b) Involvement of the lids 89 % (n?=?59)     (c) Involvement of the conjunctiva 96 % (n?=?63)     (d) Involvement of the cornea 76 % (n?=?50)     (e) Others (e.g., scleritis) 2 % (n?=?1)    Inflammatory activity  (a) No inflammation 0 % (n?=?0)     (b) Mild inflammation 33 % (n?=?22)     (c) Moderate inflammation 44 % (n?=?29)     (d) Severe inflammation 23 % (n?=?15)    Presence of sight-threatening complications / functional impairment  (a) Complications (e.g., corneal perforation) 11 % (n?=?7)     (b) Functional impairment 85 % (n?=?56)     (c) Secondary glaucoma 0 % (n?=?0)   "Applying the grading according to the Consensus Conference Proposal for Staging and Documentation Criteria for Ocular cGVHD, we were able to demonstrate that ocular cGVHD (1) frequently leads to severe ocular surface disease with involvement not only of the lacrimal glands but also of the conjunctiva, the cornea and the lids; (2) is in most cases associated with ongoing inflammatory activity of the ocular surface (100 % in our cohort even despite systemic immunosuppressive treatment); (3) often leads to functional impairment and reduced quality of life (QOL) (85 % of patients); and (4) is associated with an increased risk for severe, sight-threatening complications (11 % of patients)."  ------------------------------------------------------------------------------------------------------------          Patient was seen and examined.  Findings, assessment, and plan were discussed in detail with the patient, who verbalized understanding of and agreement with plan. All questions were answered. The  patient was instructed to call or come in if there are any new symptoms or existing symptoms persist or worsen. To call if questions or concerns: [phone], number provided. Follow up was  arranged.    I Baker Pierini, am scribing for and in the presence of Dr. Dimas Aguas.  06/09/2018 4:08 PM

## 2018-06-11 NOTE — Addendum Note (Signed)
Addended by: Patria Mane D on: 06/11/2018 09:14 AM     Modules accepted: Orders

## 2018-06-14 ENCOUNTER — Ambulatory Visit: Payer: BLUE CROSS/BLUE SHIELD | Attending: Ophthalmology | Admitting: Ophthalmology

## 2018-06-14 DIAGNOSIS — H18892 Other specified disorders of cornea, left eye: Secondary | ICD-10-CM

## 2018-06-14 DIAGNOSIS — D89813 Graft-versus-host disease, unspecified: Secondary | ICD-10-CM

## 2018-06-14 DIAGNOSIS — H16239 Neurotrophic keratoconjunctivitis, unspecified eye: Secondary | ICD-10-CM

## 2018-06-14 DIAGNOSIS — H02886 Meibomian gland dysfunction of left eye, unspecified eyelid: Secondary | ICD-10-CM

## 2018-06-14 DIAGNOSIS — H168 Other keratitis: Secondary | ICD-10-CM

## 2018-06-16 ENCOUNTER — Telehealth: Payer: Self-pay | Admitting: Ophthalmology

## 2018-06-16 NOTE — Telephone Encounter (Signed)
Patient calling to give an update to Dr Dimas Aguas on how he is feeling.  He states his comfort level is much improved with only mild discomfort and no pain.  His vision has improved around the center of cornea but not directly in the center.  Please advise .

## 2018-06-16 NOTE — Telephone Encounter (Signed)
Gave Dr. Dimas Aguas this message and also changed Mr. Bona 06/30/18 appointment.

## 2018-06-17 DIAGNOSIS — H18899 Other specified disorders of cornea, unspecified eye: Secondary | ICD-10-CM | POA: Insufficient documentation

## 2018-06-17 NOTE — Progress Notes (Signed)
Outpatient Visit      Patient name: Henry Ochoa  DOB: 1956/04/04       Age: 62 y.o.  MR#: [id number]    Encounter Date: 06/14/2018    Subjective:      Chief Complaint   Patient presents with    Eye Pain     worsening pain, decreased vision     HPI     Eye Pain      Additional comments: worsening pain, decreased vision              Comments     He feels the AMG is still on eye but the contact lens has been very   painful, the vision may be down a little, and he is light sensitive.          Last edited by Elane Fritz, MD on 06/17/2018  8:49 AM. (History)        has a current medication list which includes the following prescription(s): difluprednate, bromfenac sodium, sodium chloride, ofloxacin, dextran 70-hypromellose, acyclovir, doxycycline hyclate, lisinopril, tramadol, diltiazem, magnesium, vitamin d, metoprolol, folic acid, apixaban, doxycycline monohydrate, furosemide, spironolactone, cenegermin-bkbj, methylprednisolone, tacrolimus 0.005% in BSS (PF) ophthalmic solution, and moxifloxacin, and the following Facility-Administered Medications: sodium chloride bacteriostatic.     is allergic to meropenem.      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 (History)         Objective:     Base Eye Exam     Visual Acuity (Snellen - Linear)        Right Left    Dist sc  20/400          Pupils       Pupils    Right PERRLA    Left PERRLA          Visual Fields       Left Right     Full Full          Extraocular Movement       Right Left     Full Full          Neuro/Psych     Oriented x3:  Yes    Mood/Affect:  Normal            Slit Lamp and Fundus Exam     External Exam       Right Left    External Normal ocular adnexae, lacrimal gland & drainage, orbits Normal ocular adnexae, lacrimal gland & drainage, orbits  Slit Lamp Exam       Right Left    Lids/Lashes 2+ Meibomian gland dysfunction, 2+ Scurf, 2+ Sleeve (both upper and lower lids) 2+ Meibomian gland dysfunction, 2+ Scurf, 2+sleeve, mild to moderate (2+) subepithelial fibrosis inferiorly    Conjunctiva/Sclera Normal bulbar/palpebral, conjunctiva, sclera slight inferior subconjunctivial fibrosis, 1+upper palpebral conjunctiva  1+ Injection    Cornea 1+mucous tear film The AMG is not seen and the lens is very tight with an imprint mark on the conjunctiva when removed.  The epithelial defect is slightly smaller and there is no infiltrate.    Anterior Chamber Clear & deep Clear & deep    Iris Normal shape, size, morphology Normal shape, size, morphology    Lens Normal cortex, nucleus, anterior/posterior capsule, clarity Normal cortex, nucleus, anterior/posterior capsule, clarity    Vitreous Clear Clear                        No annotated images are attached to the encounter.      Assessment/Plan:     1. 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 Meibomian gland disease of left eye          PLAN:  There is no evidence of infection but the contact lens is too tight.  It was removed.  I re-applied a BioDOptix amnitoic membrane graft covered by an Acuevue Oasys 8.8 (a looser) lens and it did not appear too tight or too loose at the slight lamp.  He tolerated the applicatiothey will stay in touch and will text daily.  See  Monday if needed, sooner prn, otherwise about 1 week.n of the BioDOptix and bandage lens well although he was still in pain, albeit relieved by 1 drop of Tetracaine.                    Patient was seen and examined.  Findings, assessment, and plan were discussed in detail with the patient, who verbalized understanding of and agreement with plan. All questions were answered. The patient was instructed to call or come in if there are any new symptoms or existing symptoms persist or worsen. To call if questions or concerns: [phone], number provided. Follow up was arranged.      Magnus Sinning Mykeria Garman, MD, MBA, personally performed the services described in this documentation, as scribed in my presence, and attest that it is accurate and complete. All medical record entries made by the scribe were at my direction and personally dictated by me.  I have reviewed the chart and revised the note as appropriate.  I agree that the the record accurately reflects my personal performance of the history, physical examination, assessment and plan.  I have also personally written, directed, reviewed and agree with the discharge instructions.

## 2018-06-30 ENCOUNTER — Ambulatory Visit: Payer: BLUE CROSS/BLUE SHIELD | Admitting: Ophthalmology

## 2018-07-01 ENCOUNTER — Ambulatory Visit: Payer: BLUE CROSS/BLUE SHIELD | Admitting: Ophthalmology

## 2018-07-02 ENCOUNTER — Encounter: Payer: Self-pay | Admitting: Ophthalmology

## 2018-07-02 ENCOUNTER — Ambulatory Visit: Payer: BLUE CROSS/BLUE SHIELD | Attending: Ophthalmology | Admitting: Ophthalmology

## 2018-07-02 ENCOUNTER — Ambulatory Visit: Payer: BLUE CROSS/BLUE SHIELD | Admitting: Ophthalmology

## 2018-07-02 DIAGNOSIS — D89813 Graft-versus-host disease, unspecified: Secondary | ICD-10-CM

## 2018-07-02 DIAGNOSIS — H18892 Other specified disorders of cornea, left eye: Secondary | ICD-10-CM

## 2018-07-02 DIAGNOSIS — H16239 Neurotrophic keratoconjunctivitis, unspecified eye: Secondary | ICD-10-CM

## 2018-07-02 DIAGNOSIS — H168 Other keratitis: Secondary | ICD-10-CM

## 2018-07-02 DIAGNOSIS — H02886 Meibomian gland dysfunction of left eye, unspecified eyelid: Secondary | ICD-10-CM

## 2018-07-02 NOTE — Progress Notes (Signed)
Outpatient Visit      Patient name: Henry Ochoa  DOB: March 24, 1956       Age: 62 y.o.  MR#: [id number]    Encounter Date: 07/02/2018    Subjective:      Chief Complaint   Patient presents with    Follow-up     Neurotrophic keratitis, persistent epi defect OS, GVHD     HPI     Follow-up      Additional comments: Neurotrophic keratitis, persistent epi defect OS,   GVHD              Comments      Dario Yono is a 62 y.o. male here for a 2 wk f/u. He notes improvement   in distance vision. Still notes issues with blurred NVA and experiences   occasional diplopia while reading. Improvement in photophobia and pain, he     now only notes discomfort OS. He states he switched from Durezol BID to PF     TID due to intense stinging during instillation of Durezol.    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      BCL: Acuvue Oasis 8.8 OS    Ocular Meds:  Refresh Relieva PF 6-12x daily  PF TID OS  Vigamox BID OS  Tacrolimus 0.005% BID OS  Ocusoft Hypochlor 1-2x daily  Oxervate 6 times a day                Last edited by Thomes Dinning on 07/02/2018  9:58 AM. (History)        has a current medication list which includes the following prescription(s): prednisolone acetate, dextran 70-hypromellose, acyclovir, doxycycline hyclate, lisinopril, tramadol, diltiazem, magnesium, vitamin d, metoprolol, folic acid, apixaban, doxycycline monohydrate, furosemide, spironolactone, cenegermin-bkbj, methylprednisolone, tacrolimus 0.005% in BSS (PF) ophthalmic solution, moxifloxacin, difluprednate, bromfenac sodium, sodium chloride, and ofloxacin, and the following Facility-Administered Medications: sodium chloride bacteriostatic.     is allergic to meropenem.      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 (History)         Objective:     Base Eye Exam     Visual Acuity (Snellen - Linear)       Right Left    Dist cc 20/20 -1 20/150    Dist ph cc  20/70 +1    Correction:  Glasses  Pupils       Dark Light Shape React APD    Right 3 2 Round Brisk None    Left 3  Round Minimal None          Extraocular Movement       Right Left     Full Full          Neuro/Psych     Oriented x3:  Yes    Mood/Affect:  Normal            Slit Lamp and Fundus Exam     External Exam       Right Left    External Normal ocular adnexae, lacrimal gland & drainage, orbits Normal ocular adnexae, lacrimal gland & drainage, orbits          Slit Lamp Exam       Right Left    Lids/Lashes 2+ Meibomian gland dysfunction, 2+ Scurf, 2+ Sleeve (both upper and lower lids) 2+ Meibomian gland dysfunction, 2+ Scurf, 2+sleeve, mild to moderate (2+) subepithelial fibrosis inferiorly    Conjunctiva/Sclera Normal bulbar/palpebral, conjunctiva, sclera slight inferior subconjunctivial fibrosis, 1+upper palpebral conjunctiva  1+ Injection    Cornea 1+mucous tear film The AMG is not seen and the lens is very tight with an imprint mark on the conjunctiva when removed.  The epithelial defect is slightly smaller and there is no infiltrate.Interim change: epithelium surface has healed    Anterior Chamber Clear & deep Clear & deep    Iris Normal shape, size, morphology Normal shape, size,  morphology    Lens Normal cortex, nucleus, anterior/posterior capsule, clarity Normal cortex, nucleus, anterior/posterior capsule, clarity    Vitreous Clear Clear                        No annotated images are attached to the encounter.      Assessment/Plan:     1. 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 The surface of the epithelial defect has healed. We will watch this carefully and try to prevent a breakdown. Will place him in another BCL (Acuvue Oasys 8.8) without the AMG. Made slight adjustment to his medications. We will hold off on the pressure check until next visit out so that we are not touching the cornea. I will discuss decreasing the Oxervate with Dr. Marina Goodell - send him a note  They will check with Dr. Marina Goodell as well and copy me in     MGD: Signs and symptoms are consistent with meibomian gland dysfunction / blepharitis / rosacea / blepharoconjunctivitis / evaporative dry eye.  Treatment options include lid hygeine, topical steroids, topical antibiotics, optimized topical antibiotics, topical immunosuppressives, oral omega 3 fatty acids (fish / flax seed oil), oral antibiotics, eyelid scrubs with a product such as Avenova or Ocusoft Hypochlor, and targeted artificial tears.        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  Severity: moderate     Specific Grading:  Dry Eye: Aqueous / Water Deficient:  moderate    Dry Eye: Evaporative / Oil Deficient:  moderate    Dry Eye: Mucus Deficient / Filamentary Keratitis: mild  Inflammatory chronic ocular GVHD:  moderate    Treatment:  For this patient, the following regimen will be prescribed:    Refresh Relieva PF 6-12x daily  PF 2x/day OS  Vigamox 2x/day OS  Tacrolimus 0.005% 2x/day OS  Ocusoft Hypochlor 1-2x daily  Oxervate 6 times a  day      ----------------------------------------------------------------------------------------------------------  In general, treatment options include general optimization of the ocular surface environment, treatment of concomitant conditions (such as aqueous deficient and evaporative dry eye, mucus abnormalities, and anatomic eyelid abnormalities), topical steroids for acute exacerbation and when required for chronic immunosuppression, low level topical immunosuppression such as Restasis or Xiidra, enhanced topical immunosuppression such as high dose topical Cyclosporine or Tacrolimus, environmental optimization (moisture chamber occluders, humidifiers, etc), and artificial tears (either optimally preserved or non-preserved). Ancillary therapy can include autologous serum tears, autologous serum tears with platelet lysate, sutured or glued amniotic membrane graft (AMG), or an AMG placed under a bandage contact lens (BioDOptix). Therapeutic contact lens and related options include a hydrogel contact lens, silicone/hydrogel bandage contact lens, scleral lens, or ocular surface device (PROSE).  Systemic immunosuppression or increasing systemic immunosuppression may be utilized.      Follow up 1 month           Patient was seen and examined.  Findings, assessment, and plan were discussed in detail with the patient, who verbalized understanding of and agreement with plan. All questions were answered. The patient was instructed to call or come in if there are any new symptoms or existing symptoms persist or worsen. To call if questions or concerns: [phone], number provided. Follow up was arranged.    I Baker Pierini, am scribing for and in the presence of Dr. Dimas Aguas.  07/02/2018 1:54 PM    I, Ashby Dawes. Jla Reynolds, MD, MBA, personally performed the services described in this documentation, as scribed in my presence, and attest that it is accurate and complete. All medical record entries made by the scribe were  at my direction and personally dictated by me.  I have reviewed the chart and revised the note as appropriate.  I agree that the the record accurately reflects my personal performance of the history, physical examination, assessment and plan.  I have also personally directed, reviewed and agree with the discharge instructions.

## 2018-07-02 NOTE — Patient Instructions (Signed)
Refresh Relieva PF 6-12x daily  PF 2x/day left eye  Vigamox 2x/day left eye  Tacrolimus 0.005% 2x/day left eye  Ocusoft Hypochlor 1-2x/day  Oxervate 6x/day

## 2018-07-04 ENCOUNTER — Ambulatory Visit: Payer: BLUE CROSS/BLUE SHIELD | Admitting: Ophthalmology

## 2018-07-31 ENCOUNTER — Ambulatory Visit: Payer: BLUE CROSS/BLUE SHIELD | Attending: Ophthalmology | Admitting: Ophthalmology

## 2018-07-31 DIAGNOSIS — D89813 Graft-versus-host disease, unspecified: Secondary | ICD-10-CM

## 2018-07-31 DIAGNOSIS — H04129 Dry eye syndrome of unspecified lacrimal gland: Secondary | ICD-10-CM

## 2018-07-31 DIAGNOSIS — H18892 Other specified disorders of cornea, left eye: Secondary | ICD-10-CM

## 2018-07-31 DIAGNOSIS — H02886 Meibomian gland dysfunction of left eye, unspecified eyelid: Secondary | ICD-10-CM

## 2018-07-31 DIAGNOSIS — H168 Other keratitis: Secondary | ICD-10-CM

## 2018-07-31 NOTE — Patient Instructions (Signed)
Refresh Relieva PF every 30 min to 1 hour in right eye  Pred Forte 2x/day left eye  Vigamox 2x/day left eye  Tacrolimus 0.005% 2x/day both eyes   Ocusoft Hypochlor 1-2x daily

## 2018-07-31 NOTE — Progress Notes (Signed)
Outpatient Visit      Patient name: Henry Ochoa  DOB: 1956/05/25       Age: 62 y.o.  MR#: [id number]    Encounter Date: 07/31/2018    Subjective:      Chief Complaint   Patient presents with    Follow-up     Dry eye MGD Neurotrophic keratitis     HPI     Follow-up      Additional comments: Dry eye MGD Neurotrophic keratitis              Comments      Rami Budhu is a 62 y.o. male. Pt notes OS feeling much better, feels   tacrolimus better than Oxervate. Notes vision much better OS over past   month    Ocular Medications:  Refresh Relieva PF every 30 min to 1 hour OD  PF 2x/day OS  Vigamox 2x/day OS  Tacrolimus 0.005% 2x/day OS  Ocusoft Hypochlor 1-2x daily  Oxervate 6 times a dayD/C as scheduled  BCL Acuvue Oasis 8.8 OS                Last edited by Thomes Dinning on 07/31/2018  3:41 PM. (History)        has a current medication list which includes the following prescription(s): prednisolone acetate, bromfenac sodium, dextran 70-hypromellose, acyclovir, doxycycline hyclate, lisinopril, tramadol, diltiazem, magnesium, vitamin d, metoprolol, folic acid, apixaban, doxycycline monohydrate, furosemide, spironolactone, cenegermin-bkbj, methylprednisolone, tacrolimus 0.005% in BSS (PF) ophthalmic solution, moxifloxacin, difluprednate, sodium chloride, and ofloxacin, and the following Facility-Administered Medications: sodium chloride bacteriostatic.     is allergic to meropenem.      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 (History)         Objective:     Base Eye Exam     Visual Acuity (Snellen - Linear)       Right Left    Dist cc 20/20 -1 20/60 -2    Dist ph cc  20/30 -1    Correction:  Glasses          Pupils       Dark Light Shape React    Right 3 2 Round Slow    Left 3 1 Round Slow          Visual Fields       Left Right     Full Full          Extraocular Movement       Right Left     Full, Ortho Full, Ortho  Neuro/Psych     Oriented x3:  Yes    Mood/Affect:  Normal          Dilation     Both eyes:  2.5% Phenylephrine, 1.0% Tropicamide @ 3:33 PM            Slit Lamp and Fundus Exam     External Exam       Right Left    External Normal ocular adnexae, lacrimal gland & drainage, orbits Normal ocular adnexae, lacrimal gland & drainage, orbits          Slit Lamp Exam       Right Left    Lids/Lashes 2+ Meibomian gland dysfunction, 2+ Scurf, 2+ Sleeve (both upper and lower lids) 2+ Meibomian gland dysfunction, mild to moderate (2+) subepithelial fibrosis inferiorly, 1+lid debris    Conjunctiva/Sclera Normal bulbar/palpebral, conjunctiva, sclera slight inferior subconjunctivial fibrosis, 1+upper palpebral conjunctiva Interim change: less swelling of the upper paplbebal conjujnuctiva  1+ Injection    Cornea Normal epithelium, stroma, endothelium, tear film The AMG is not seen and the lens is very tight with an imprint mark on the conjunctiva when removed. The epithelial defect is slightly smaller and there is no infiltrate. Previous Interim change: epithelium surface has healedInterim change: less haze    Anterior Chamber Clear & deep Clear & deep    Iris Normal shape, size, morphology Normal shape, size, morphology    Lens Normal cortex, nucleus, anterior/posterior capsule, clarity Normal cortex, nucleus, anterior/posterior capsule, clarity    Vitreous Clear Clear                        No annotated images are attached to the encounter.       Assessment/Plan:     1. 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 Meibomian gland disease of left eye     5. Dry eye        PLAN:    He is doing much better in terms of symptoms and vision in the left eye. Currently in an Acuvue Oasys 8.8 in the left eye. The surface of the epithelial defect has healed. We will watch this carefully and try to prevent a breakdown. Removed contact lens from left eye and applied fluorescein to see if the epithelial defect has remained healed, which it is. Discussed leaving him out of the lens for now, but will consider this at next visit. Replaced lens with new of the same. We discussed his current treatment regimen and decided maintain his current doses.     I will discuss decreasing the Oxervate with Dr. Marina Goodell - give him a call back    Write Dr. Anabel Bene at [facility] a letter - [facility]    MGD: Signs and symptoms are consistent with meibomian gland dysfunction / blepharitis / rosacea / blepharoconjunctivitis / evaporative dry eye. Treatment options include lid hygeine, topical steroids, topical antibiotics, optimized topical antibiotics, topical immunosuppressives, oral omega 3 fatty acids (fish / flax seed oil), oral antibiotics, eyelid scrubs with a product such as Avenova or Ocusoft Hypochlor, and targeted artificial tears.          Drs. Marina Goodell and [place],  He is doing much better in terms of symptoms and vision in the left eye. Currently in an Acuvue Oasys 8.8 in the left eye. The surface of the epithelial defect has  healed. We will watch this carefully and try to prevent a breakdown. Dr. Marina Goodell, he will see you in 1 month and if you could, please replace the bandage lens.  I will see him in 2 months.  He is much improved but still has signficant GVHD.  The Tacrolimus is doing well.  We may increase the concentration from 0.005% to 0.03% in the future.    Ocular GVHD and Dry Eye  Evaluation  Signs and symptoms are consistent with OCULAR GVHD.  Severity:moderate    Specific Grading:  Dry Eye: Aqueous / Water Deficient:moderate    Dry Eye: Evaporative / Oil Deficient:moderate    Dry Eye: Mucus Deficient / Filamentary Keratitis: mild    Inflammatory chronic ocular GVHD:moderate    Treatment:  For this patient, the following regimen will be prescribed:    Refresh Relieva PF every 30 min to 1 hour in right eye  Pred Forte 2x/day left eye  Vigamox 2x/day left eye  Tacrolimus 0.005% 2x/day both eyes   Ocusoft Hypochlor 1-2x daily    Follow up 1 month with Dr. Marina Goodell    Follow up with me 2 months         Patient was seen and examined.  Findings, assessment, and plan were discussed in detail with the patient, who verbalized understanding of and agreement with plan. All questions were answered. The patient was instructed to call or come in if there are any new symptoms or existing symptoms persist or worsen. To call if questions or concerns: [phone], number provided. Follow up was arranged.    I Baker Pierini, am scribing for and in the presence of Dr. Dimas Aguas.  07/31/2018 3:59 PM    I, Ashby Dawes. Helios Kohlmann, MD, MBA, personally performed the services described in this documentation, as scribed in my presence, and attest that it is accurate and complete. All medical record entries made by the scribe were at my direction and personally dictated by me.  I have reviewed the chart and revised the note as appropriate.  I agree that the the record accurately reflects my personal performance of the history, physical examination, assessment and plan. I have also personally directed, reviewed and agree with the discharge instructions.

## 2018-09-30 ENCOUNTER — Other Ambulatory Visit: Payer: Self-pay | Admitting: Ophthalmology

## 2018-09-30 MED ORDER — TACROLIMUS 5 MG/ML IV SOLN *I*
1.0000 [drp] | Freq: Two times a day (BID) | INTRAVENOUS | 3 refills | Status: DC
Start: 2018-09-30 — End: 2019-02-06

## 2018-10-02 ENCOUNTER — Encounter: Payer: Self-pay | Admitting: Ophthalmology

## 2018-10-02 ENCOUNTER — Ambulatory Visit: Payer: BLUE CROSS/BLUE SHIELD | Attending: Ophthalmology | Admitting: Ophthalmology

## 2018-10-02 DIAGNOSIS — H02886 Meibomian gland dysfunction of left eye, unspecified eyelid: Secondary | ICD-10-CM

## 2018-10-02 DIAGNOSIS — D89813 Graft-versus-host disease, unspecified: Secondary | ICD-10-CM

## 2018-10-02 DIAGNOSIS — H168 Other keratitis: Secondary | ICD-10-CM

## 2018-10-02 DIAGNOSIS — H18892 Other specified disorders of cornea, left eye: Secondary | ICD-10-CM

## 2018-10-02 DIAGNOSIS — H04129 Dry eye syndrome of unspecified lacrimal gland: Secondary | ICD-10-CM

## 2018-10-02 NOTE — Progress Notes (Signed)
Outpatient Visit      Patient name: Henry Ochoa  DOB: 09/16/1955       Age: 63 y.o.  MR#: [id number]    Encounter Date: 10/02/2018    Subjective:      Chief Complaint   Patient presents with    Follow-up    Dry Eye     HPI      2 mo f/u-- He notes at his visit last month, Dr. Robinette Haines switched him from   PF to Inveltys BID due to irritation he was experiencing with the PF.   According to Dr. Robinette Haines, there is still scarring of the left cornea. He   also notes starting around 2 week ago, the onset of severe headaches by   midday. He has stopped using his glasses for distance and states the   frequency of headaches has improved. He recently changed his CL ~10 days   ago due to irritation.      BCL: Acuvue Oasys 8.8 OS    Ocular Meds:  Refresh Relieva PF q1Hr OU  Inveltys BID OS   Vigamox BID OS  Tacrolimus 0.005% BID OS, once daily OD  Ocusoft Hypochlor once daily           Last edited by Tyrone Schimke on 10/02/2018 11:50 AM. (History)        has a current medication list which includes the following prescription(s): loteprednol etabonate, tacrolimus 0.005% in BSS (PF) ophthalmic solution, dextran 70-hypromellose, acyclovir, doxycycline hyclate, lisinopril, tramadol, diltiazem, magnesium, vitamin d, metoprolol, folic acid, apixaban, doxycycline monohydrate, furosemide, spironolactone, methylprednisolone, moxifloxacin, prednisolone acetate, difluprednate, bromfenac sodium, sodium chloride, ofloxacin, and cenegermin-bkbj.     is allergic to meropenem.      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 (History)         Objective:     Base Eye Exam     Visual Acuity (Snellen - Linear)       Right Left    Dist cc 20/20 20/60 +2    Dist ph cc  20/25 -2    Correction:  Glasses          Tonometry (Tonopen, 11:50 AM)       Right Left    Pressure 14 16          Pupils       Dark Light Shape React APD    Right 3 2 Round Brisk None    Left  3 2 Round Brisk None          Visual Fields (Counting fingers)       Left Right     Full Full          Extraocular Movement       Right Left     Full Full          Neuro/Psych     Oriented x3:  Yes    Mood/Affect:  Normal            Slit Lamp and Fundus Exam     External Exam       Right Left    External Normal ocular adnexae, lacrimal gland & drainage, orbits Normal ocular adnexae, lacrimal gland & drainage, orbits          Slit Lamp Exam       Right Left    Lids/Lashes 2+ Meibomian gland dysfunction, 2+ Scurf, 2+ Sleeve (both upper and lower lids) 2+ Meibomian gland dysfunction, mild to moderate (2+) subepithelial fibrosis inferiorly, 1+lid debris    Conjunctiva/Sclera Normal bulbar/palpebral, conjunctiva, sclera slight inferior subconjunctivial fibrosis, 1+upper palpebral conjunctiva Interim change: less swelling of the upper paplbebal conjujnuctiva  1+ Injection    Cornea Normal epithelium, stroma, endothelium, tear film The AMG is not seen and the lens is very tight with an imprint mark on the conjunctiva when removed. The epithelial defect is slightly smaller and there is no infiltrate. Previous Interim change: epithelium surface has healedInterim change: less hazeInterim change: Epithelium is intact with fading central opacity     Anterior Chamber Clear & deep Clear &  deep    Iris Normal shape, size, morphology Normal shape, size, morphology    Lens Normal cortex, nucleus, anterior/posterior capsule, clarity Normal cortex, nucleus, anterior/posterior capsule, clarity    Vitreous Clear Clear            Refraction     Wearing Rx       Sphere Cylinder Axis Add    Right -1.00 +0.50 025 +2.50    Left -1.25 Sphere  +2.50    Type:  PAL                        No annotated images are attached to the encounter.      Assessment/Plan:     1. 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 Meibomian gland disease of left eye     5. Dry eye        PLAN:    1-2. Neurotrophic keratitis / Epithelial defect OS    07/31/18  He is doing much better in terms of symptoms and vision in the left eye  The surface of the epithelial defect has healed  We will watch this carefully and try to prevent a breakdown  Removed contact lens from left eye and applied fluorescein to see if the epithelial defect has remained healed, which it has   Discussed leaving him out of the lens for now, but will consider this at next visit  Replaced lens with new of the same (Acuvue Oasys 8.8)  We discussed his current treatment regimen and decided maintain his current doses     10/02/18  Epithelium is intact  Central opacity is still present but is fading   I feel that getting him to 20/30 is a reasonable  expectation   Discussed considering fitting him for a scleral contact lens to neutralize the irregular astigmatism in the left cornea to improve the quality of vision  Explained the inconvenience of the lens in terms of learning to use and wear it, as well as the possible benefits      Dry eye/ MGD  Will have him try using saline tears as needed to relieve his symptoms, as there is some  mucous on the ocular surface    Write letter to Dr. Leonia Reeves GVHD and Dry Eye Evaluation  Signs and symptoms are consistent with OCULAR  GVHD  Severity:moderate    Specific Grading:  Dry Eye: Aqueous / Water Deficient:moderate    Dry Eye: Evaporative / Oil Deficient:moderate    Dry Eye: Mucus Deficient / Filamentary Keratitis: mild    Inflammatory chronic ocular GVHD:moderate    Current treatment:  Refresh Relieva PF q1Hr OD   Inveltys BID OS   Vigamox BID OS  Tacrolimus 0.005% BID OS, once daily OD  Ocusoft Hypochlor once daily      Follow up:  2 months       Patient was seen and examined.  Findings, assessment, and plan were discussed in detail with the patient, who verbalized understanding of and agreement with plan. All questions were answered. The patient was instructed to call or come in if there are any new symptoms or existing symptoms persist or worsen. To call if questions or concerns: [phone], number provided. Follow up was arranged.    I Baker Pierini, am scribing for and in the presence of Dr. Dimas Aguas.  10/03/2018 9:27 AM    I, Ashby Dawes. Plotnik, MD, MBA, personally performed the services described in this documentation, as scribed in my presence, and attest that it is accurate and complete. All medical record entries made by the scribe were at my direction and personally dictated by me. I have reviewed the chart and revised the note as appropriate. I agree that the the record accurately reflects my personal performance of the history, physical examination, assessment and plan. I have also personally directed, reviewed and agree with the discharge instructions.

## 2018-10-17 ENCOUNTER — Other Ambulatory Visit: Payer: Self-pay | Admitting: Ophthalmology

## 2018-10-18 MED ORDER — MOXIFLOXACIN HCL 0.5 % OP SOLN *I*
1.0000 [drp] | Freq: Two times a day (BID) | OPHTHALMIC | 3 refills | Status: DC
Start: 2018-10-18 — End: 2019-03-09

## 2018-11-18 ENCOUNTER — Other Ambulatory Visit: Payer: Self-pay | Admitting: Cardiology

## 2018-11-18 NOTE — Telephone Encounter (Signed)
°*  STAT* If patient is at the pharmacy, call can be transferred to refill team.   1. Which medications need to be refilled? (please list name of each medication and dose if known) Levothyroxine 25mg   2. Which pharmacy/location (including street and city if local pharmacy) is medication to be sent to? [facility]  3. Do they need a 30 day or 90 day supply? 90

## 2018-11-19 MED ORDER — LEVOTHYROXINE SODIUM 25 MCG PO TABS: 25.0000 ug | ORAL_TABLET | Freq: Every day | ORAL | 1 refills | Status: DC

## 2018-11-19 NOTE — Telephone Encounter (Signed)
Refill sent.

## 2018-12-11 ENCOUNTER — Ambulatory Visit: Payer: BLUE CROSS/BLUE SHIELD | Admitting: Ophthalmology

## 2018-12-12 ENCOUNTER — Encounter: Payer: Self-pay | Admitting: Ophthalmology

## 2018-12-12 ENCOUNTER — Ambulatory Visit: Payer: BLUE CROSS/BLUE SHIELD | Admitting: Ophthalmology

## 2018-12-12 DIAGNOSIS — H168 Other keratitis: Secondary | ICD-10-CM

## 2018-12-12 DIAGNOSIS — D89813 Graft-versus-host disease, unspecified: Secondary | ICD-10-CM

## 2018-12-12 MED ORDER — INVELTYS 1 % OP SUSP
1.0000 [drp] | Freq: Two times a day (BID) | OPHTHALMIC | 6 refills | Status: AC
Start: 2018-12-12 — End: ?

## 2018-12-12 NOTE — Progress Notes (Signed)
Outpatient Visit      Patient name: Henry Ochoa  DOB: [birth year]       Age: 63 y.o.  MR#: [id number]    Encounter Date: 12/12/2018    Subjective:      Chief Complaint   Patient presents with    New Patient Visit     Neurotrophic keratitis OS, GVHD     HPI     New Patient Visit      Additional comments: Neurotrophic keratitis OS, GVHD              Comments      Henry Ochoa is a 63 y.o. male here for a 2 mo f/u for neurotrophic   keratitis OS. He notes that vision has been stable, unimproved. He has   been changing his own contact lenses, last replaced ~10 days ago. He   states he has consistent headaches while using his normal glasses and is   only using OTC readers.      BCL: Acuvue Oasys 8.8 OS       Ocular Meds:   Refresh Relieva PF PRN OU  Inveltys BID OS   Vigamox BID OS  Tacrolimus 0.005% BID OS, once daily OD  Ocusoft Hypochlor once daily                  Last edited by Tyrone Schimke on 12/12/2018  8:49 AM. (History)        has a current medication list which includes the following prescription(s): moxifloxacin, tacrolimus 0.005% in BSS (PF) ophthalmic solution, dextran 70-hypromellose, acyclovir, doxycycline hyclate, lisinopril, tramadol, diltiazem, magnesium, vitamin d, metoprolol, folic acid, apixaban, doxycycline monohydrate, furosemide, spironolactone, methylprednisolone, inveltys, bromfenac sodium, sodium chloride, ofloxacin, and cenegermin-bkbj.     is allergic to meropenem.      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 (History)         Objective:     Base Eye Exam     Visual Acuity (Snellen - Linear)       Right Left    Dist cc 20/20 -1 20/150 +1          Pupils       Dark Light Shape React APD    Right 3 2 Round Slow None    Left 3 2 Round Slow None          Extraocular Movement       Right Left  Full Full          Neuro/Psych     Oriented x3:  Yes    Mood/Affect:  Normal            Slit Lamp and Fundus Exam     External Exam       Right Left    External Normal ocular adnexae, lacrimal gland & drainage, orbits Normal ocular adnexae, lacrimal gland & drainage, orbits          Slit Lamp Exam       Right Left    Lids/Lashes 2+ Meibomian gland dysfunction, 2+ Scurf, 2+ Sleeve (both upper and lower lids) 2+ Meibomian gland dysfunction, mild to moderate (2+) subepithelial fibrosis inferiorly, 1+lid debris    Conjunctiva/Sclera Normal bulbar/palpebral, conjunctiva, sclera slight inferior subconjunctivial fibrosis, 1+upper palpebral conjunctiva Interim change: less swelling of the upper paplbebal conjujnuctiva  1+ Injection    Cornea Normal epithelium, stroma, endothelium, tear film The AMG is not seen and the lens is very tight with an imprint mark on the conjunctiva when removed. The epithelial defect is slightly smaller and there is no infiltrate. Previous Interim change: epithelium surface has healedInterim change: less hazeInterim change: Epithelium is intact with fading central opacity     Anterior Chamber Clear & deep Clear & deep    Iris Normal shape, size, morphology Normal shape, size, morphology    Lens Normal cortex, nucleus, anterior/posterior capsule, clarity Normal cortex, nucleus, anterior/posterior capsule, clarity    Vitreous Clear Clear                        No  annotated images are attached to the encounter.      Assessment/Plan:     1. Neurotrophic keratitis     2. 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 Neurotrophic keratitis / Epithelial defect OS    07/31/18  He is doing much better in terms of symptoms and vision in the left eye  The surface of the epithelial defect has healed  We will watch this carefully and try to prevent a breakdown  Removed contact lens from left eye and applied fluorescein to see if the epithelial defect has remained healed, which it has   Discussed leaving him out of the lens for now, but will consider this at next visit  Replaced lens with new of the same (Acuvue Oasys 8.8)  We discussed his current treatment regimen and decided maintain his current doses     10/02/18  Epithelium is intact  Central opacity is still present but is fading   I feel that getting him to 20/30 is a reasonable expectation   Discussed considering fitting him for a scleral contact lens to neutralize the irregular astigmatism in the left cornea to improve the quality of vision  Explained the inconvenience of the lens in terms of learning to use and wear it, as well as the possible benefits      12/12/18  Doing well, very stable  Continue to change BCLs every 14 days as before (he does at home)  No change in medication  He will be updating MRx in the coming months with optometrist    Dry eye/ MGD  Will have him try using saline tears as needed to relieve his symptoms, as there is some  mucous on the ocular surface    Write letter to Dr. 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 Dimas Aguas

## 2019-02-02 ENCOUNTER — Telehealth: Payer: Self-pay | Admitting: Ophthalmology

## 2019-02-02 NOTE — Telephone Encounter (Signed)
Patient saw his OD today and was told OS has an increase of pressure.  OS was 24 with bandage contact and 28 without.  He would like to know if he needs to adjust his steroid drops.  Please advise

## 2019-02-03 NOTE — Telephone Encounter (Signed)
Spoke to patient and let him know Dr. Dimas Aguas wants him to continue the drops given to him by the other doctor and to lower the Inveltys to daily on Monday, Wednesday and Friday.

## 2019-02-06 ENCOUNTER — Other Ambulatory Visit: Payer: Self-pay | Admitting: Ophthalmology

## 2019-02-06 MED ORDER — TACROLIMUS 5 MG/ML IV SOLN *I*
1.0000 [drp] | Freq: Two times a day (BID) | INTRAVENOUS | 3 refills | Status: DC
Start: 2019-02-06 — End: 2019-02-09

## 2019-02-09 ENCOUNTER — Telehealth: Payer: Self-pay | Admitting: Ophthalmology

## 2019-02-09 MED ORDER — TACROLIMUS 5 MG/ML IV SOLN *I*
1.0000 [drp] | Freq: Two times a day (BID) | INTRAVENOUS | 3 refills | Status: DC
Start: 2019-02-09 — End: 2019-02-10

## 2019-02-09 NOTE — Telephone Encounter (Signed)
Patient states [facility] states they did not receive the fax for tacrolimus 0.005% in BSS (PF) ophthalmic solution.  Please re fax.  Also the patient would like to know if he should wear his contacts to his appointment with Dr Dimas Aguas 6/23.  Please advise

## 2019-02-10 ENCOUNTER — Encounter: Payer: Self-pay | Admitting: Ophthalmology

## 2019-02-10 ENCOUNTER — Other Ambulatory Visit: Payer: Self-pay | Admitting: Ophthalmology

## 2019-02-10 ENCOUNTER — Ambulatory Visit: Payer: BLUE CROSS/BLUE SHIELD | Admitting: Ophthalmology

## 2019-02-10 DIAGNOSIS — H02886 Meibomian gland dysfunction of left eye, unspecified eyelid: Secondary | ICD-10-CM

## 2019-02-10 DIAGNOSIS — H168 Other keratitis: Secondary | ICD-10-CM

## 2019-02-10 DIAGNOSIS — D89813 Graft-versus-host disease, unspecified: Secondary | ICD-10-CM

## 2019-02-10 DIAGNOSIS — H18892 Other specified disorders of cornea, left eye: Secondary | ICD-10-CM

## 2019-02-10 DIAGNOSIS — H04129 Dry eye syndrome of unspecified lacrimal gland: Secondary | ICD-10-CM

## 2019-02-10 MED ORDER — TACROLIMUS 5 MG/ML IV SOLN *I*
1.0000 [drp] | Freq: Two times a day (BID) | INTRAVENOUS | 3 refills | Status: AC
Start: 2019-02-10 — End: ?

## 2019-02-10 NOTE — Progress Notes (Signed)
Outpatient Visit      Patient name: Henry Ochoa  DOB: May 22, 1956       Age: 63 y.o.  MR#: [id number]    Encounter Date: 02/10/2019    Subjective:      Chief Complaint   Patient presents with    Follow-up     Follow up for Neurotrophic keratitis / Epithelial defect OS     HPI     Follow-up      Additional comments: Follow up for Neurotrophic keratitis / Epithelial   defect OS              Comments      Henry Ochoa is a 63 y.o. male states that he feels has has stability   with   his eyes, he has not had improvement or worsening of symptoms.   Saw optometrist at [facility] [place] with High IOP.   Wearing contact lenses.   Current treatment:  Artifical tears preservative free q1Hr OS   Inveltys MWF OS   Vigamox BID OS  Tacrolimus 0.005% BID OS, once daily OD  Lumigan QHS OS  Combigan BID OS  Ocusoft Hypochlor once daily                     Last edited by Terrilee Croak on 02/10/2019  3:18 PM. (History)        has a current medication list which includes the following prescription(s): tacrolimus 0.005% in BSS (PF) ophthalmic solution, inveltys, moxifloxacin, dextran 70-hypromellose, acyclovir, doxycycline hyclate, lisinopril, tramadol, diltiazem, magnesium, vitamin d, metoprolol, folic acid, apixaban, doxycycline monohydrate, furosemide, spironolactone, methylprednisolone, bromfenac sodium, sodium chloride, ofloxacin, and cenegermin-bkbj.     is allergic to meropenem.      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 (History)         Objective:     Base Eye Exam     Visual Acuity (Snellen - Linear)       Right Left    Dist cc 20/30 20/30 -2    Correction:  Contacts   Alcon multifocal lenses           Tonometry (Tonopen, 3:07 PM)       Right Left    Pressure 14 12          Pupils       Shape    Right Round    Left Round  Neuro/Psych     Oriented x3:  Yes    Mood/Affect:  Normal            Slit Lamp and Fundus Exam     External Exam       Right Left    External Normal ocular adnexae, lacrimal gland & drainage, orbits Normal ocular adnexae, lacrimal gland & drainage, orbits          Slit Lamp Exam       Right Left    Lids/Lashes 1.5 Meibomian gland dysfunction, 2+ Scurf, 2+ Sleeve (both upper and lower lids)  2+ Meibomian gland dysfunction, mild to moderate (2+) subepithelial fibrosis inferiorly, 1+lid debris    Conjunctiva/Sclera Normal bulbar/palpebral, conjunctiva, sclera slight inferior subconjunctivial fibrosis, 1+upper palpebral conjunctiva Interim change: less swelling of the upper paplbebal conjujnuctiva  1-2+ nasal Injection    Cornea Normal epithelium, stroma, endothelium, tear film Trace PEE Fading Central Opacity     Anterior Chamber Clear & deep Clear & deep    Iris Normal shape, size, morphology Normal shape, size, morphology    Lens Normal cortex, nucleus, anterior/posterior capsule, clarity Normal cortex, nucleus, anterior/posterior capsule, clarity    Vitreous Clear Clear            Contact Lens Exam     Current Contact Lens Rx       Brand Base Curve Diameter Sphere Add    Right Dailies Total One Multifocal 8.5 14.1 -2.50 +2.50    Left Dailies Total One Multifocal 8.5 14.1 +3.50 +2.50                              Assessment/Plan:     1. 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 Meibomian gland disease of left eye          PLAN:    1-2. Neurotrophic keratitis / Epithelial defect OS    07/31/18  He is doing much better in terms of symptoms and vision in the left eye  The surface of the epithelial defect has healed  We will watch this carefully and try to prevent a breakdown  Removed contact lens from left eye and applied fluorescein to see if the epithelial defect has remained healed, which it has   Discussed leaving him out of the lens for now, but will consider this at next visit  Replaced lens with new of the same (Acuvue Oasys 8.8)  We discussed his current treatment regimen and decided maintain his current doses    10/02/18  Epithelium is intact  Central opacity is still present but is fading   I feel that getting him to 20/30 is a reasonable expectation   Discussed considering fitting him for a scleral contact lens to neutralize the irregular astigmatism in the left cornea to improve the quality of vision  Explained the inconvenience of the lens in terms of learning to use and wear it, as well as the possible benefits      02/10/19  Doing well  Central opacity is present but fading  Continue current medications    Dry eye/ MGD  Will have him try using saline tears as needed to relieve his symptoms, as there is some  mucous on the ocular surface      Ocular GVHD  and Dry Eye Evaluation  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  Findings, assessment, and plan were discussed in detail with the patient, who verbalized understanding of and agreement with plan. All questions were answered. The patient was instructed to call or come in if there are any new symptoms or existing symptoms persist or worsen. To call if questions or concerns: [phone], number provided. Follow up was arranged. Certain parts of this note may have been carried over from prior Ophthalmology notes to maintain accuracy of patient's pertinent medical history and continuity of care. The details were verified and edited as appropriate.      I Verdia Kuba Tameyah Koch, am scribing for and in the presence of Dr. Dimas Aguas.  02/10/2019 3:32 PM     I, Ashby Dawes. Plotnik, MD, MBA, personally performed the services described in this documentation, as scribed in my presence, and attest that it is accurate and complete. All medical record entries made by the scribe were at my direction and personally dictated by me.  I have reviewed the chart and revised the note as appropriate.  I agree that the the record accurately reflects my personal performance of the history, physical examination, assessment and plan.  I have also personally directed, reviewed and agree with the discharge instructions.

## 2019-02-10 NOTE — Telephone Encounter (Signed)
Script sent  

## 2019-02-17 ENCOUNTER — Ambulatory Visit: Payer: BLUE CROSS/BLUE SHIELD | Admitting: Ophthalmology

## 2019-02-24 ENCOUNTER — Telehealth: Payer: Self-pay | Admitting: Ophthalmology

## 2019-02-24 NOTE — Telephone Encounter (Signed)
Patient wanted to update provider , states he stop taking Combigan  last Friday on 7/3 and had pressures check yesterday 7/6 -OS 13 -OD 16. Also wanted to know when to stop Lumigan

## 2019-02-25 ENCOUNTER — Other Ambulatory Visit: Payer: Self-pay | Admitting: Ophthalmology

## 2019-02-25 MED ORDER — BIMATOPROST 0.03 % OP SOLN *I*
1.0000 [drp] | Freq: Every evening | OPHTHALMIC | 0 refills | Status: AC
Start: 2019-02-25 — End: 2019-03-27

## 2019-02-25 NOTE — Telephone Encounter (Signed)
Spoke to patient and told him to stop the Lumigan 2 days before his appointment.  A refill request was sent to provider.

## 2019-02-27 ENCOUNTER — Telehealth: Payer: Self-pay | Admitting: Ophthalmology

## 2019-02-27 NOTE — Telephone Encounter (Signed)
Pt stated his lumigan is a different strength then the sample he had.

## 2019-03-03 NOTE — Telephone Encounter (Signed)
Spoke to patient and let him know it was ok to use the Lumigan with the different strength.

## 2019-03-06 ENCOUNTER — Other Ambulatory Visit: Payer: Self-pay | Admitting: Ophthalmology

## 2019-03-06 ENCOUNTER — Other Ambulatory Visit: Payer: Self-pay

## 2019-03-06 MED FILL — Tacrolimus Inj 5 MG/ML: INTRAVENOUS | 7 days supply | Qty: 5 | Fill #0 | Status: AC

## 2019-03-06 MED FILL — Tacrolimus Inj 5 MG/ML: INTRAVENOUS | 9 days supply | Qty: 5 | Fill #0 | Status: CN

## 2019-03-09 ENCOUNTER — Other Ambulatory Visit: Payer: Self-pay

## 2019-03-10 ENCOUNTER — Other Ambulatory Visit: Payer: Self-pay

## 2019-03-12 ENCOUNTER — Other Ambulatory Visit: Payer: Self-pay

## 2019-03-31 ENCOUNTER — Other Ambulatory Visit: Payer: Self-pay

## 2019-03-31 MED FILL — Tacrolimus Inj 5 MG/ML: INTRAVENOUS | 7 days supply | Qty: 5 | Fill #1 | Status: AC

## 2019-04-01 ENCOUNTER — Other Ambulatory Visit: Payer: Self-pay

## 2019-04-02 ENCOUNTER — Other Ambulatory Visit: Payer: Self-pay

## 2019-04-13 ENCOUNTER — Other Ambulatory Visit: Payer: Self-pay

## 2019-04-21 ENCOUNTER — Ambulatory Visit: Payer: BLUE CROSS/BLUE SHIELD | Admitting: Ophthalmology

## 2019-04-21 DIAGNOSIS — H168 Other keratitis: Secondary | ICD-10-CM

## 2019-04-21 DIAGNOSIS — H02886 Meibomian gland dysfunction of left eye, unspecified eyelid: Secondary | ICD-10-CM

## 2019-04-21 DIAGNOSIS — D89813 Graft-versus-host disease, unspecified: Secondary | ICD-10-CM

## 2019-04-21 DIAGNOSIS — H04129 Dry eye syndrome of unspecified lacrimal gland: Secondary | ICD-10-CM

## 2019-04-21 DIAGNOSIS — H18892 Other specified disorders of cornea, left eye: Secondary | ICD-10-CM

## 2019-04-21 NOTE — Progress Notes (Signed)
Outpatient Visit      Patient name: Henry Ochoa  DOB: [birth year]       Age: 63 y.o.  MR#: [id number]    Encounter Date: 04/21/2019    Subjective:      Chief Complaint   Patient presents with    Follow-up      Neurotrophic keratitis / Epithelial defect OS      HPI     Follow-up      Additional comments:  Neurotrophic keratitis / Epithelial defect OS               Comments      Derl Gregorius is a 63 y.o. male. Pt notes vision stable, vision better   with   contacts than glasses. Notes 12 14 hours daily with contacts. Pt has   questions regarding continued use of antibiotic, also possible use of   xiidra? Pt is using Dailies Total One Multifocal contact lens.     Ocular Medications:   Artifical tears preservative free q1Hr OS [employer]   Inveltys MWF OS   Vigamox BID OS  Tacrolimus 0.005% BID OS, once daily OD  Lumigan QHS OS STOPPED last used fri  Ocusoft Hypochlor once daily                   Last edited by Terrilee Croak on 04/21/2019  5:20 PM. (History)        has a current medication list which includes the following prescription(s): moxifloxacin, tacrolimus 0.005% in BSS (PF) ophthalmic solution, balanced salts, balanced salts, tacrolimus 0.005% in BSS (PF) ophthalmic solution, inveltys, bromfenac sodium, sodium chloride, ofloxacin, dextran 70-hypromellose, acyclovir, doxycycline hyclate, lisinopril, tramadol, diltiazem, magnesium, vitamin d, metoprolol succinate er, folic acid, apixaban, doxycycline monohydrate, furosemide, spironolactone, cenegermin-bkbj, and methylprednisolone.     is allergic to meropenem.      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        Specialty Problems        Ophthalmology Problems    Dry eye        MGD (meibomian  gland disease)        Neurotrophic keratitis        Persistent epithelial defect of cornea                    Objective:     Base Eye Exam     Visual Acuity (Snellen - Linear)       Right Left    Dist cc 20/30 +1 20/30 -1    Correction:  Glasses          Tonometry (Tonopen, 4:06 PM)       Right Left    Pressure 12 11          Pupils       Dark Light Shape React    Right 4 3 Round Slow    Left 4 3 Round Slow          Visual Fields       Left Right     Full Full  Extraocular Movement       Right Left     Full, Ortho Full, Ortho          Neuro/Psych     Oriented x3:  Yes    Mood/Affect:  Normal            Slit Lamp and Fundus Exam     External Exam       Right Left    External Normal ocular adnexae, lacrimal gland & drainage, orbits Normal ocular adnexae, lacrimal gland & drainage, orbits          Slit Lamp Exam       Right Left    Lids/Lashes 1.5 Meibomian gland dysfunction, 2+ Scurf, 2+ Sleeve (both upper and lower lids)  1.5+ Meibomian gland dysfunction, mild to moderate (2+) subepithelial fibrosis inferiorly, 1+lid debris    Conjunctiva/Sclera Normal bulbar/palpebral, conjunctiva, sclera slight inferior subconjunctivial fibrosis, 1+upper palpebral conjunctiva Interim change: less swelling of the upper paplbebal conjujnuctiva  Normal bulbar/palpebral, conjunctiva, sclera slight inferior subconjunctivial fibrosis, 1+upper palpebral conjunctiva Interim change: less swelling of the upper paplbebal conjujnuctiva     Cornea 0.5+ Punctate epithelial erosions 2+ PEE Fading Central Opacity     Anterior Chamber Clear & deep Clear & deep    Iris Normal shape, size, morphology Normal shape, size, morphology    Lens Normal cortex, nucleus, anterior/posterior capsule, clarity Normal cortex, nucleus, anterior/posterior capsule, clarity    Vitreous Clear Clear                              Assessment/Plan:     1. 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 Dry eye     4. GVHD (graft  versus host disease)     5. Meibomian gland disease of left eye          PLAN:    1-2. Neurotrophic keratitis / Epithelial defect OS    07/31/18  He is doing much better in terms of symptoms and vision in the left eye  The surface of the epithelial defect has healed  We will watch this carefully and try to prevent a breakdown  Removed contact lens from left eye and applied fluorescein to see if the epithelial defect has remained healed, which ithas  Discussed leaving him out of the lens for now, but will consider this at next visit  Replaced lens with new of the same(Acuvue Oasys 8.8)  We discussed his current treatment regimen and decided maintain his current doses    10/02/18  Epithelium is intact  Central opacity is still present but is fading   I feel that getting him to 20/30 is a reasonable expectation   Discussed considering fitting him for a scleral contact lens to neutralize the irregular astigmatism in the left cornea to improve the quality of vision  Explained the inconvenience of the lens in terms of learning to use and wear it, as well as the possible benefits     02/10/19  Doing well  Central opacity is present but fading  Continue current medications    Dry eye/ MGD  Will have him try using saline tears as needed to relieve his symptoms,asthere is somemucous on the ocular surface    04/21/19  No scarring of the conjunctiva, little active GVHD. The corneal defect on the left eye looks much better-healed.  Scar fading.  Still with some  NV. Patient looks good overall.     Ocular Medications:   Artifical tears preservative free q1Hr left eye   Inveltys M/W/F left eye   Tacrolimus 0.005% twice a day left eye, once daily right eye  Ocusoft Hypochlor once daily     Follow up  2 months       Patient was seen and examined.  Findings, assessment, and plan were discussed in detail with the patient, who verbalized understanding of and agreement with plan. All questions were answered. The patient was  instructed to call or come in if there are any new symptoms or existing symptoms persist or worsen. To call if questions or concerns: [phone], number provided. Follow up was arranged. Certain parts of this note may have been carried over from prior Ophthalmology notes to maintain accuracy of patient's pertinent medical history and continuity of care. The details were verified and edited as appropriate.      I Gemma Payor, am scribing for and in the presence of Dr. Dimas Aguas  04/21/2019 5:20 PM    I, Ashby Dawes. Briannon Boggio, MD, MBA, personally performed the services described in this documentation, as scribed in my presence, and attest that it is accurate and complete. All medical record entries made by the scribe were at my direction and personally dictated by me.  I have reviewed the chart and revised the note as appropriate.  I agree that the the record accurately reflects my personal performance of the history, physical examination, assessment and plan.  I have also personally directed, reviewed and agree with the discharge instructions.

## 2019-04-30 ENCOUNTER — Telehealth: Payer: Self-pay | Admitting: Ophthalmology

## 2019-04-30 NOTE — Telephone Encounter (Signed)
Miranda from [employer] that a pre authorization was faxed and is in cover my meds portal for Xiidra.  Please advise

## 2019-05-04 NOTE — Telephone Encounter (Signed)
Waiting for a response from insurance.

## 2019-05-08 ENCOUNTER — Other Ambulatory Visit: Payer: Self-pay

## 2019-05-08 MED FILL — Tacrolimus Inj 5 MG/ML: INTRAVENOUS | 7 days supply | Qty: 5 | Fill #2 | Status: AC

## 2019-05-10 ENCOUNTER — Other Ambulatory Visit: Payer: Self-pay

## 2019-05-12 ENCOUNTER — Other Ambulatory Visit: Payer: Self-pay

## 2019-05-27 ENCOUNTER — Other Ambulatory Visit: Payer: Self-pay

## 2019-06-02 ENCOUNTER — Other Ambulatory Visit: Payer: Self-pay | Admitting: Cardiology

## 2019-06-02 NOTE — Telephone Encounter (Signed)
Levothyroxine refill sent 

## 2019-06-05 ENCOUNTER — Other Ambulatory Visit: Payer: Self-pay | Admitting: Ophthalmology

## 2019-06-05 ENCOUNTER — Other Ambulatory Visit: Payer: Self-pay

## 2019-06-05 NOTE — Telephone Encounter (Signed)
Pt is calling to get a authorization on the medication(Tacrolimus) for refills. Can you call Pt once it's been approved.           Thank you

## 2019-06-08 ENCOUNTER — Other Ambulatory Visit: Payer: Self-pay

## 2019-06-11 ENCOUNTER — Other Ambulatory Visit: Payer: Self-pay

## 2019-06-11 ENCOUNTER — Other Ambulatory Visit: Payer: Self-pay | Admitting: Ophthalmology

## 2019-06-11 NOTE — Telephone Encounter (Signed)
Pt is waiting on Dr. Dimas Aguas to Henry Ochoa the refill for medication (tacrolimus)  due to him being out of of eyedrops. He stated this is urgent.

## 2019-06-13 ENCOUNTER — Other Ambulatory Visit: Payer: Self-pay

## 2019-06-13 MED ORDER — TACROLIMUS 5 MG/ML IV SOLN *I*
INTRAVENOUS | 3 refills | Status: DC
Start: 2019-06-13 — End: 2019-09-15
  Filled 2019-06-13: qty 5, 7d supply, fill #0
  Filled 2019-07-05: qty 5, 7d supply, fill #1
  Filled 2019-08-02: qty 5, 7d supply, fill #2
  Filled 2019-08-24: qty 5, 7d supply, fill #3

## 2019-06-15 ENCOUNTER — Other Ambulatory Visit: Payer: Self-pay

## 2019-06-16 ENCOUNTER — Other Ambulatory Visit: Payer: Self-pay

## 2019-06-22 ENCOUNTER — Ambulatory Visit: Payer: BLUE CROSS/BLUE SHIELD | Admitting: Ophthalmology

## 2019-06-22 DIAGNOSIS — D89813 Graft-versus-host disease, unspecified: Secondary | ICD-10-CM

## 2019-06-22 DIAGNOSIS — H04129 Dry eye syndrome of unspecified lacrimal gland: Secondary | ICD-10-CM

## 2019-06-22 DIAGNOSIS — H18892 Other specified disorders of cornea, left eye: Secondary | ICD-10-CM

## 2019-06-22 DIAGNOSIS — H02886 Meibomian gland dysfunction of left eye, unspecified eyelid: Secondary | ICD-10-CM

## 2019-06-22 DIAGNOSIS — H168 Other keratitis: Secondary | ICD-10-CM

## 2019-06-22 NOTE — Progress Notes (Signed)
Outpatient Visit      Patient name: Henry Ochoa  DOB: October 13, 1955       Age: 63 y.o.  MR#: [id number]    Encounter Date: 06/22/2019    Subjective:      Chief Complaint   Patient presents with    Follow-up     2 mo Neurotrophic keratitis / Epithelial defect OS      HPI     Follow-up      Additional comments: 2 mo Neurotrophic keratitis / Epithelial defect OS                 Comments      Henry Ochoa is a 63 y.o. male. Pt notes had recent exam for glasses, new     rx given, still waiting for glasses. Pt notes vision a bit better without   glasses. Notes no pain or discomfort. Pt notes IOP 14 OD 16 OS at time of   apt, aprox 1 week ago.    Ocular Medications:   Artifical tears preservative free q1Hr left eye   Inveltys M/W/F left eye   Tacrolimus 0.005% twice a day left eye, once daily right eye  Ocusoft Hypochlor once daily                 Last edited by Terrilee Croak on 06/22/2019 11:32 AM. (History)        has a current medication list which includes the following prescription(s): tacrolimus 0.005% in BSS (PF) ophthalmic solution, tacrolimus 0.005% in BSS (PF) ophthalmic solution, inveltys, balanced salts, dextran 70-hypromellose, lisinopril, magnesium, vitamin d, metoprolol succinate er, folic acid, apixaban, furosemide, spironolactone, moxifloxacin, bromfenac sodium, sodium chloride, ofloxacin, acyclovir, doxycycline hyclate, tramadol, diltiazem, doxycycline monohydrate, cenegermin-bkbj, and methylprednisolone.     is allergic to meropenem.      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 (History)         Objective:     Base Eye Exam     Visual Acuity (Snellen - Linear)       Right Left    Dist sc 20/25 20/60 +2    Dist ph sc  20/25    Correction: Glasses          Tonometry (Tonopen, 11:23 AM)       Right Left    Pressure 14 15          Pupils       Dark Light Shape React  Right 3 2 Round Brisk    Left 3 2 Round Brisk          Extraocular Movement       Right Left     Full, Ortho Full, Ortho          Neuro/Psych     Oriented x3: Yes    Mood/Affect: Normal            Slit Lamp and Fundus Exam     External Exam       Right Left    External Normal ocular adnexae, lacrimal gland & drainage, orbits Normal ocular adnexae, lacrimal gland & drainage, orbits          Slit Lamp Exam       Right Left    Lids/Lashes 1.5 Meibomian gland dysfunction, 2+ Scurf, 2+ Sleeve (both upper and lower lids)  1.5+ Meibomian gland dysfunction, mild to moderate (2+) subepithelial fibrosis inferiorly, 1+lid debris    Conjunctiva/Sclera Normal bulbar/palpebral, conjunctiva, sclera slight inferior subconjunctivial fibrosis, 1+upper palpebral conjunctiva Interim change: less swelling of the upper paplbebal conjujnuctiva  Normal bulbar/palpebral, conjunctiva, sclera slight inferior subconjunctivial fibrosis, 1+upper palpebral conjunctiva Interim change: less swelling of the upper paplbebal conjujnuctiva     Cornea 0.5+ Punctate epithelial erosions 2+ PEE Fading Central Opacity     Anterior Chamber Clear & deep Clear & deep    Iris Normal shape, size, morphology Normal shape, size, morphology    Lens Normal cortex, nucleus, anterior/posterior capsule, clarity Normal cortex, nucleus, anterior/posterior capsule, clarity     Vitreous Clear Clear            Refraction     Wearing Rx       Sphere Cylinder Axis Add    Right -1.00 +0.50 025 +2.50    Left -1.25 Sphere  +2.50    Type: PAL                              Assessment/Plan:     1. 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 Meibomian gland disease of left eye          PLAN:    1-2. Neurotrophic keratitis / Epithelial defect OS    07/31/18  He is doing much better in terms of symptoms and vision in the left eye  The surface of the epithelial defect has healed  We will watch this carefully and try to prevent a breakdown  Removed contact lens from left eye and applied fluorescein to see if the epithelial defect has remained healed, which ithas  Discussed leaving him out of the lens for now, but will consider this at next visit  Replaced lens with new of the same(Acuvue Oasys 8.8)  We discussed his current treatment regimen and decided maintain his current doses    10/02/18  Epithelium is intact  Central opacity is still present but is fading   I feel that getting him to 20/30 is a reasonable expectation   Discussed considering fitting him for a scleral contact lens to neutralize the irregular astigmatism in the left cornea to improve the quality of vision  Explained the inconvenience of the lens in terms of learning to use and wear it, as well as the possible benefits    02/10/19  Doing  well  Central opacity is present but fading  Continue current medications    Dry eye/ MGD  Will have him try using saline tears as needed to relieve his symptoms,asthere is somemucous on the ocular surface    04/21/19  No scarring of the conjunctiva, little active GVHD. The corneal defect on the left eye looks much better-healed.  Scar fading.  Still with some NV. Patient looks good overall.     06/22/19  There is no scarring of the conjunctiva OU. Will have him decrease the Tacrolimus to once a day OU.  Overall he is looking much better.     Ocular Medications:   Artifical tears preservative free q1Hr left eye   Inveltys M/Th left eye (can use an extra drop in both eyes as needed)   Tacrolimus 0.005% twice a day both eyes   Ocusoft Hypochlor once daily      Follow up   3 months     Patient was seen and examined.  Findings, assessment, and plan were discussed in detail with the patient, who verbalized understanding of and agreement with plan. All questions were answered. The patient was instructed to call or come in if there are any new symptoms or existing symptoms persist or worsen. To call if questions or concerns: [phone], number provided. Follow up was arranged. Certain parts of this note may have been carried over from prior Ophthalmology notes to maintain accuracy of patient's pertinent medical history and continuity of care. The details were verified and edited as appropriate.      I Gemma Payor, am scribing for and in the presence of Dr. Dimas Aguas  06/22/2019 11:36 AM    I, Ashby Dawes. Plotnik, MD, MBA, personally performed the services described in this documentation, as scribed in my presence, and attest that it is accurate and complete. All medical record entries made by the scribe were at my direction and personally dictated by me.  I have reviewed the chart and revised the note as appropriate.  I agree that the the record accurately reflects my personal performance of the history, physical examination, assessment and plan.  I have also personally directed, reviewed and agree with the discharge instructions.

## 2019-06-27 ENCOUNTER — Other Ambulatory Visit: Payer: Self-pay | Admitting: Cardiology

## 2019-06-29 ENCOUNTER — Other Ambulatory Visit: Payer: Self-pay

## 2019-06-29 NOTE — Telephone Encounter (Signed)
Levothyroxine refill sent to [facility] in [place]. Overdue for follow-up visit

## 2019-07-01 ENCOUNTER — Other Ambulatory Visit: Payer: Self-pay

## 2019-07-05 ENCOUNTER — Other Ambulatory Visit: Payer: Self-pay

## 2019-07-06 ENCOUNTER — Other Ambulatory Visit: Payer: Self-pay

## 2019-07-07 ENCOUNTER — Other Ambulatory Visit: Payer: Self-pay

## 2019-07-08 ENCOUNTER — Other Ambulatory Visit: Payer: Self-pay

## 2019-07-18 ENCOUNTER — Other Ambulatory Visit: Payer: Self-pay | Admitting: Cardiology

## 2019-07-22 ENCOUNTER — Telehealth: Payer: Self-pay | Admitting: Cardiology

## 2019-07-22 NOTE — Telephone Encounter (Signed)
Patient called requesting a referral to endocrinologist

## 2019-07-23 ENCOUNTER — Ambulatory Visit: Payer: 59 | Admitting: Cardiology

## 2019-07-23 ENCOUNTER — Encounter: Payer: Self-pay | Admitting: Cardiology

## 2019-07-23 ENCOUNTER — Other Ambulatory Visit: Payer: Self-pay

## 2019-07-23 VITALS — BP 126/76 | HR 101 | Ht 67.0 in | Wt 177.8 lb

## 2019-07-23 DIAGNOSIS — R0600 Dyspnea, unspecified: Secondary | ICD-10-CM

## 2019-07-23 DIAGNOSIS — E785 Hyperlipidemia, unspecified: Secondary | ICD-10-CM

## 2019-07-23 DIAGNOSIS — E039 Hypothyroidism, unspecified: Secondary | ICD-10-CM | POA: Diagnosis not present

## 2019-07-23 DIAGNOSIS — I2510 Atherosclerotic heart disease of native coronary artery without angina pectoris: Secondary | ICD-10-CM | POA: Diagnosis not present

## 2019-07-23 DIAGNOSIS — R0609 Other forms of dyspnea: Secondary | ICD-10-CM

## 2019-07-23 NOTE — Progress Notes (Signed)
Cardiology Office Note:    Date:  07/23/2019   ID:  Andre Zimmerman, DOB 1956-06-30, MRN [id number]  PCP:  Lawerance Cruel, MD  Cardiologist:  Jenne Campus, MD    Referring MD: Lawerance Cruel, MD   Chief Complaint  Patient presents with  . Follow-up  Doing well  History of Present Illness:    Andre Zimmerman is a 63 y.o. male with remote history of coronary artery disease apparently more than 10 years ago he did have some issues he had some testing done apparently everything can find I last year I seen him for the first time we did stress test which showed no evidence of ischemia we did echocardiogram showed preserved left ventricle ejection fraction still the goal right now is to modify his risk factors for coronary disease. Overall he is doing well. Denies having any chest pain, tightness, pressure, burning in the chest. He is very active and have no difficulty doing it. He is thinking about retiring in February.  Past Medical History:  Diagnosis Date  . Heart attack ([place])   . Hyperlipidemia     Past Surgical History:  Procedure Laterality Date  . NO PAST SURGERIES      Current Medications: Current Meds  Medication Sig  . ACAI PO Take 1 capsule by mouth daily.  [name] glucosamine-chondroitin 500-400 MG tablet Take 1 tablet by mouth 3 (three) times daily.  [name] levothyroxine (SYNTHROID) 25 MCG tablet TAKE 1 TABLET BY MOUTH DAILY BEFORE BREAKFAST.  . Multiple Vitamin (MULTIVITAMIN) capsule Take 1 capsule by mouth daily.  . Red Yeast Rice Extract (RED YEAST RICE PO) Take 1 capsule by mouth daily.     Allergies:   Patient has no known allergies.   Social History   Socioeconomic History  . Marital status: Divorced    Spouse name: Not on file  . Number of children: Not on file  . Years of education: Not on file  . Highest education level: Not on file  Occupational History  . Not on file  Social Needs  . Financial resource strain: Not on file  . Food insecurity   Worry: Not on file    Inability: Not on file  . Transportation needs    Medical: Not on file    Non-medical: Not on file  Tobacco Use  . Smoking status: Never Smoker  . Smokeless tobacco: Never Used  Substance and Sexual Activity  . Alcohol use: Yes    Comment: rare  . Drug use: No  . Sexual activity: Not on file  Lifestyle  . Physical activity    Days per week: Not on file    Minutes per session: Not on file  . Stress: Not on file  Relationships  . Social Herbalist on phone: Not on file    Gets together: Not on file    Attends religious service: Not on file    Active member of club or organization: Not on file    Attends meetings of clubs or organizations: Not on file    Relationship status: Not on file  Other Topics Concern  . Not on file  Social History Narrative  . Not on file     Family History: The patient's family history includes Cancer in his mother; Diabetes in his mother; Heart disease in his father. ROS:   Please see the history of present illness.    All 14 point review of systems negative except as described per  history of present illness  EKGs/Labs/Other Studies Reviewed:      Recent Labs: No results found for requested labs within last 8760 hours.  Recent Lipid Panel No results found for: CHOL, TRIG, HDL, CHOLHDL, VLDL, LDLCALC, LDLDIRECT  Physical Exam:    VS:  BP 126/76   Pulse (!) 101   Ht 5\' 7"  (1.702 m)   Wt 177 lb 12.8 oz (80.6 kg)   SpO2 96%   BMI 27.85 kg/m     Wt Readings from Last 3 Encounters:  07/23/19 177 lb 12.8 oz (80.6 kg)  12/26/17 180 lb (81.6 kg)  12/10/17 179 lb (81.2 kg)     GEN:  Well nourished, well developed in no acute distress HEENT: Normal NECK: No JVD; No carotid bruits LYMPHATICS: No lymphadenopathy CARDIAC: RRR, no murmurs, no rubs, no gallops RESPIRATORY:  Clear to auscultation without rales, wheezing or rhonchi  ABDOMEN: Soft, non-tender, non-distended MUSCULOSKELETAL:  No edema; No  deformity  SKIN: Warm and dry LOWER EXTREMITIES: no swelling NEUROLOGIC:  Alert and oriented x 3 PSYCHIATRIC:  Normal affect   ASSESSMENT:    1. Coronary artery disease involving native coronary artery of native heart without angina pectoris   2. Dyspnea on exertion   3. Dyslipidemia   4. Acquired hypothyroidism    PLAN:    In order of problems listed above:  1. Coronary artery disease doing well from that point review denies have any symptoms stress test done last year negative. We will continue present management which would include aspirin. 2. Dyspnea on exertion denies having any 3. Dyslipidemia we will schedule him to have fasting lipid profile done 4. Hypothyroidism TSH will be checked and again I recommend you to look for primary care physician. We were talking about last time but he did not do it I told him is critically essential because there are certain things that we do not do that he will need to have done like colonoscopy that he is most likely due. He promised me to look for primary care physician in the meantime we will do some basic laboratory test as well as EKG   Medication Adjustments/Labs and Tests Ordered: Current medicines are reviewed at length with the patient today.  Concerns regarding medicines are outlined above.  No orders of the defined types were placed in this encounter.  Medication changes: No orders of the defined types were placed in this encounter.   Signed, 12/12/17, MD, [facility] 07/23/2019 3:19 PM     [facility]

## 2019-07-23 NOTE — Addendum Note (Signed)
Addended by: Ashok Norris on: 07/23/2019 03:32 PM   Modules accepted: Orders

## 2019-07-23 NOTE — Telephone Encounter (Signed)
Called patient. He needs refill on synthroid we haven't seen him in over 1 year. He was scheduled for appointment today.

## 2019-07-23 NOTE — Patient Instructions (Signed)
Medication Instructions:  Your physician recommends that you continue on your current medications as directed. Please refer to the Current Medication list given to you today.  *If you need a refill on your cardiac medications before your next appointment, please call your pharmacy*  Lab Work: Your physician recommends that you return for lab work today: lipids, tsh, cmp, cbc   If you have labs (blood work) drawn today and your tests are completely normal, you will receive your results only by: [name] MyChart Message (if you have MyChart) OR . A paper copy in the mail If you have any lab test that is abnormal or we need to change your treatment, we will call you to review the results.  Testing/Procedures: None.   Follow-Up: At [facility], you and your health needs are our priority.  As part of our continuing mission to provide you with exceptional heart care, we have created designated Provider Care Teams.  These Care Teams include your primary Cardiologist (physician) and Advanced Practice Providers (APPs -  Physician Assistants and Nurse Practitioners) who all work together to provide you with the care you need, when you need it.  Your next appointment:   6 month(s)  The format for your next appointment:   In Person  Provider:   You may see Dr. Agustin Cree or the following Advanced Practice Provider on your designated Care Team:    Laurann Montana, FNP   Other Instructions

## 2019-07-25 LAB — LIPID PANEL
Chol/HDL Ratio: 2.9 ratio (ref 0.0–5.0)
Cholesterol, Total: 216 mg/dL — ABNORMAL HIGH (ref 100–199)
HDL: 75 mg/dL (ref >39)
LDL Chol Calc (NIH): 130 mg/dL — ABNORMAL HIGH (ref 0–99)
Triglycerides: 63 mg/dL (ref 0–149)
VLDL Cholesterol Cal: 11 mg/dL (ref 5–40)

## 2019-07-25 LAB — CBC
Hematocrit: 48.1 % (ref 37.5–51.0)
Hemoglobin: 15.9 g/dL (ref 13.0–17.7)
MCH: 29 pg (ref 26.6–33.0)
MCHC: 33.1 g/dL (ref 31.5–35.7)
MCV: 88 fL (ref 79–97)
Platelets: 225 10*3/uL (ref 150–450)
RBC: 5.49 x10E6/uL (ref 4.14–5.80)
RDW: 13 % (ref 11.6–15.4)
WBC: 5.5 10*3/uL (ref 3.4–10.8)

## 2019-07-25 LAB — COMPREHENSIVE METABOLIC PANEL
ALT: 29 IU/L (ref 0–44)
AST: 22 IU/L (ref 0–40)
Albumin/Globulin Ratio: 1.6 (ref 1.2–2.2)
Albumin: 4.1 g/dL (ref 3.8–4.8)
Alkaline Phosphatase: 80 IU/L (ref 39–117)
BUN/Creatinine Ratio: 17 (ref 10–24)
BUN: 20 mg/dL (ref 8–27)
Bilirubin Total: 0.6 mg/dL (ref 0.0–1.2)
CO2: 26 mmol/L (ref 20–29)
Calcium: 9.4 mg/dL (ref 8.6–10.2)
Chloride: 102 mmol/L (ref 96–106)
Creatinine, Ser: 1.19 mg/dL (ref 0.76–1.27)
GFR calc Af Amer: 75 mL/min/{1.73_m2} (ref >59)
GFR calc non Af Amer: 65 mL/min/{1.73_m2} (ref >59)
Globulin, Total: 2.5 g/dL (ref 1.5–4.5)
Glucose: 83 mg/dL (ref 65–99)
Potassium: 4.7 mmol/L (ref 3.5–5.2)
Sodium: 139 mmol/L (ref 134–144)
Total Protein: 6.6 g/dL (ref 6.0–8.5)

## 2019-07-25 LAB — TSH: TSH: 3.86 u[IU]/mL (ref 0.450–4.500)

## 2019-07-30 ENCOUNTER — Telehealth: Payer: Self-pay | Admitting: Cardiology

## 2019-08-02 ENCOUNTER — Other Ambulatory Visit: Payer: Self-pay

## 2019-08-03 ENCOUNTER — Telehealth: Payer: Self-pay | Admitting: Cardiology

## 2019-08-03 ENCOUNTER — Other Ambulatory Visit: Payer: Self-pay

## 2019-08-03 DIAGNOSIS — E785 Hyperlipidemia, unspecified: Secondary | ICD-10-CM

## 2019-08-03 MED ORDER — ATORVASTATIN CALCIUM 10 MG PO TABS: 10.0000 mg | ORAL_TABLET | Freq: Every day | ORAL | 1 refills | Status: DC

## 2019-08-03 MED ORDER — LEVOTHYROXINE SODIUM 25 MCG PO TABS: ORAL_TABLET | ORAL | 1 refills | Status: DC

## 2019-08-03 NOTE — Telephone Encounter (Signed)
Please call patient with lab results

## 2019-08-03 NOTE — Telephone Encounter (Signed)
Called patient informed him of lab results. Informed him per Dr. Kandis Mannan ito start Lipitor 10 mg daily and have labs redrawn in 6 weeks patient verbally understood. During call patient asked for a synthroid refill, this was filled. No further questions.

## 2019-08-04 ENCOUNTER — Other Ambulatory Visit: Payer: Self-pay

## 2019-08-05 ENCOUNTER — Other Ambulatory Visit: Payer: Self-pay

## 2019-08-19 ENCOUNTER — Telehealth: Payer: Self-pay | Admitting: Ophthalmology

## 2019-08-19 NOTE — Telephone Encounter (Signed)
Patient is requesting to speak to Dr Dimas Aguas about switching tacrolimus 0.005% in BSS (PF) ophthalmic solution medication. Please advise.

## 2019-08-24 ENCOUNTER — Other Ambulatory Visit: Payer: Self-pay

## 2019-08-24 NOTE — Telephone Encounter (Signed)
email sent to Dr Dimas Aguas

## 2019-08-24 NOTE — Telephone Encounter (Addendum)
Patient called wanted to know he hadn't heard anything back. Told him if he wanted to speak with Dr. Dimas Aguas he was out of the office until tomorrow.   Dissatisfied with my response he wanted to speak with "her". I explained to him that if this is for a medication switch (confirmed with the patient) speaking with the doctor is in his best interest. The patient explained that the medication that he is being prescribed is not made in [place]. Patient explained that this is time sensitive because he is leaving "for a while".   Patient was dissatisfied with the response I was giving him.         Spoke with Ephraim Hamburger who sending Dr. Dimas Aguas a staff message and an email.

## 2019-08-25 ENCOUNTER — Other Ambulatory Visit: Payer: Self-pay

## 2019-08-26 ENCOUNTER — Other Ambulatory Visit: Payer: Self-pay | Admitting: Cardiology

## 2019-08-26 ENCOUNTER — Other Ambulatory Visit: Payer: Self-pay

## 2019-08-26 NOTE — Telephone Encounter (Signed)
Spoke to patient and let him know that we can change the strength of the Tacrolimus so we can send the prescription to a pharmacy closer to his home.

## 2019-08-26 NOTE — Telephone Encounter (Signed)
Patient is calling back and is very upset. Patient states he was told by 2 different people yesterday that told him that Dr. Dimas Aguas would be back in the office today. Patient was not happy with the fact that I told him I would be putting in a message to have a call back to him. Patient states this has been gong on for to long and he needs the medication issue to be resolved before he leaves in a few day. Patient would like a call back at some point today, I informed the patient I could not prmise that but would mark the message urgent.

## 2019-09-01 ENCOUNTER — Other Ambulatory Visit: Payer: Self-pay | Admitting: Nurse Practitioner

## 2019-09-01 ENCOUNTER — Other Ambulatory Visit: Payer: Self-pay

## 2019-09-01 ENCOUNTER — Ambulatory Visit
Admission: RE | Admit: 2019-09-01 | Discharge: 2019-09-01 | Disposition: A | Discharge status: Home / Self Care | Payer: No Typology Code available for payment source | Source: Ambulatory Visit | Attending: Nurse Practitioner | Admitting: Nurse Practitioner

## 2019-09-01 DIAGNOSIS — Z0000 Encounter for general adult medical examination without abnormal findings: Secondary | ICD-10-CM

## 2019-09-15 ENCOUNTER — Other Ambulatory Visit: Payer: Self-pay | Admitting: Ophthalmology

## 2019-09-15 MED ORDER — TACROLIMUS 5 MG/ML IV SOLN *I*
1.0000 [drp] | Freq: Two times a day (BID) | INTRAVENOUS | 3 refills | Status: DC
Start: 2019-09-15 — End: 2019-09-23

## 2019-09-15 NOTE — Telephone Encounter (Signed)
Patient states that coordinator was supposed to send medication refill to [facility] yesterday. Patient is following up. Please advise.

## 2019-09-16 ENCOUNTER — Telehealth: Payer: Self-pay | Admitting: Ophthalmology

## 2019-09-16 NOTE — Telephone Encounter (Signed)
Per Dr Dimas Aguas ok to use 0.03% verbal given to pharmacy

## 2019-09-16 NOTE — Telephone Encounter (Signed)
Patient called and states that would like to speak with the coordinator regarding his prescription. Patient would like a call back from the coordinator to receive some instructions. Please advise.

## 2019-09-16 NOTE — Telephone Encounter (Signed)
Pharmacy states that tacrolimus 0.005% in BSS (PF) is going to be very expensive to fill. They state that they can do 0.02 or 0.03% much cheaper.  Please advise

## 2019-09-16 NOTE — Telephone Encounter (Signed)
I sent an email and staff message to Dr Dimas Aguas

## 2019-09-17 ENCOUNTER — Other Ambulatory Visit: Payer: Self-pay | Admitting: Ophthalmology

## 2019-09-17 MED ORDER — TACROLIMUS 5 MG/ML IV SOLN *I*
1.0000 [drp] | Freq: Two times a day (BID) | OPHTHALMIC | 2 refills | Status: AC
Start: 2019-09-17 — End: ?

## 2019-09-23 ENCOUNTER — Other Ambulatory Visit: Payer: Self-pay

## 2019-09-23 ENCOUNTER — Ambulatory Visit: Payer: BLUE CROSS/BLUE SHIELD | Admitting: Ophthalmology

## 2019-09-23 DIAGNOSIS — D89813 Graft-versus-host disease, unspecified: Secondary | ICD-10-CM

## 2019-09-23 DIAGNOSIS — H168 Other keratitis: Secondary | ICD-10-CM

## 2019-09-23 DIAGNOSIS — H18892 Other specified disorders of cornea, left eye: Secondary | ICD-10-CM

## 2019-09-23 DIAGNOSIS — H04129 Dry eye syndrome of unspecified lacrimal gland: Secondary | ICD-10-CM

## 2019-09-23 DIAGNOSIS — H02886 Meibomian gland dysfunction of left eye, unspecified eyelid: Secondary | ICD-10-CM

## 2019-09-23 MED ORDER — TACROLIMUS 5 MG/ML IV SOLN *I*
1.0000 [drp] | Freq: Two times a day (BID) | INTRAVENOUS | 3 refills | Status: DC
Start: 2019-09-23 — End: 2019-12-02
  Filled 2019-09-23: qty 5, 20d supply, fill #0
  Filled 2019-10-16: qty 5, 9d supply, fill #1
  Filled 2019-11-11: qty 5, 9d supply, fill #2
  Filled 2019-12-02: qty 5, 9d supply, fill #3

## 2019-09-23 NOTE — Progress Notes (Signed)
Outpatient Visit      Patient name: Henry Ochoa  DOB: May 11, 1956       Age: 64 y.o.  MR#: [id number]    Encounter Date: 09/23/2019    Subjective:      Chief Complaint   Patient presents with    Follow-up     3 mo  Neurotrophic keratitis / Epithelial defect OS      HPI     Follow-up      Additional comments: 3 mo  Neurotrophic keratitis / Epithelial defect OS                 Comments      Henry Ochoa is a 64 y.o. male. Pt notes problems D/T change in   tacrolimus   0.003% causing severe burning lasting 15-20 min before clearing, also   notes preservatives in 0.003. Pt notes needs to order tac from strong   instead. He is having trouble obtaining 0.005. He reports increased   stinging and burning with use of the 0.003.     Ocular Medications:   Artifical tears preservative free q1Hr left eye   Inveltys M/w/f OS  Tacrolimus 0.003 twice a day both eyes   Ocusoft Hypochlor once daily                       Last edited by Demetrius Charity on 09/23/2019 11:42 AM. (History)        has a current medication list which includes the following prescription(s): tacrolimus 0.005% in BSS (PF) ophthalmic solution, tacrolimus (PROTOPIC) 0.03% cmpd ophthalmic solution, moxifloxacin, tacrolimus 0.005% in BSS (PF) ophthalmic solution, inveltys, balanced salts, bromfenac sodium, sodium chloride, ofloxacin, dextran 70-hypromellose, acyclovir, doxycycline hyclate, lisinopril, tramadol, diltiazem, magnesium, vitamin d, metoprolol succinate er, folic acid, apixaban, doxycycline monohydrate, furosemide, spironolactone, cenegermin-bkbj, and methylprednisolone.     is allergic to meropenem.      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 (History)         Objective:     Base Eye Exam     Visual Acuity (Snellen - Linear)       Right Left    Dist cc 20/25 -1 20/25 -1    Correction: Glasses          Tonometry (Tonopen, 11:08 AM)       Right Left    Pressure 14 15  Pupils       Dark Light Shape React    Right 4 3 Round Slow    Left 4 3 Round Slow          Extraocular Movement       Right Left     Full, Ortho Full, Ortho          Neuro/Psych     Oriented x3: Yes    Mood/Affect: Normal            Slit Lamp and Fundus Exam     External Exam       Right Left    External Normal ocular adnexae, lacrimal gland & drainage, orbits Normal ocular adnexae, lacrimal gland & drainage, orbits          Slit Lamp Exam       Right Left    Lids/Lashes 1 Meibomian gland dysfunction, 2+ Scurf, 2+ Sleeve (both upper and lower lids)  1+ Meibomian gland dysfunction, mild to moderate (2+) subepithelial fibrosis inferiorly, 1+lid debris    Conjunctiva/Sclera 2+ conjunctivochalasis 2+ conjunctivochalasis    Cornea 2+ Punctate epithelial erosions and a band about a quarter from the bottom, 1+ mucous 2+ Punctate epithelial erosions and a band about a quarter from the bottom, 1+ mucous, slight subepithelial fibrosis    Anterior Chamber Clear & deep Clear & deep    Iris Normal shape, size, morphology Normal shape, size, morphology    Lens Normal cortex, nucleus, anterior/posterior capsule, clarity Normal cortex, nucleus, anterior/posterior capsule, clarity    Vitreous Clear Clear                              Assessment/Plan:     1. 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    MGD: Signs and symptoms are consistent with meibomian gland dysfunction / blepharitis / rosacea / blepharoconjunctivitis / evaporative dry eye.  Treatment options include lid hygeine, topical steroids, topical antibiotics, optimized topical antibiotics, topical immunosuppressives, oral omega 3 fatty acids (fish / flax seed oil), oral antibiotics, eyelid scrubs with a product such as Avenova or Ocusoft Hypochlor, and targeted artificial tears.        07/31/18  He is doing much better in terms of symptoms and vision in the left eye  The surface of the epithelial defect has healed  We will watch this carefully and try to prevent a breakdown  Removed contact lens from left eye and applied fluorescein to see if the epithelial defect has remained healed, which ithas  Discussed leaving him out of the lens for now, but will consider this at next visit  Replaced lens with new of the same(Acuvue Oasys 8.8)  We discussed his current treatment regimen and decided maintain his current doses    10/02/18  Epithelium is intact  Central opacity is still present but is fading   I feel that getting him to 20/30 is a reasonable expectation   Discussed considering fitting him for a scleral contact lens to neutralize the irregular astigmatism in the left cornea to improve the quality of vision  Explained the inconvenience of the  lens in terms of learning to use and wear it, as well as the possible benefits    02/10/19  Doing well  Central opacity is present but fading  Continue current medications    Dry eye/ MGD  Will have him try using saline tears as needed to relieve his symptoms,asthere is somemucous on the ocular surface    04/21/19  No scarring of the conjunctiva,little  activeGVHD. The corneal defect on the left eye looks much better-healed. Scar fading. Still with some NV. Patient looks good overall.    06/22/19  There is no scarring of the conjunctiva OU. Will have him decrease the Tacrolimus to once a day OU. Overall he is looking much better.     09/23/19  Stable and doing well on current regimen. He has been unable to obtain 0.005% from a local pharmacy and is experiencing discomfort with use of 0.03%. Switch to Tacrolimus 0.005% twice daily in both eyes.       Ocular meds:   Artifical tears preservative free q1Hr left eye   Inveltys M/W/F left eye (can use an extra drop in both eyes as needed)   Tacrolimus 0.005% twice a day both eyes   Ocusoft Hypochlor once daily      Follow up:   3 months      Patient was seen and examined.  Findings, assessment, and plan were discussed in detail with the patient, who verbalized understanding of and agreement with plan. All questions were answered. The patient was instructed to call or come in if there are any new symptoms or existing symptoms persist or worsen. To call if questions or concerns: [phone], number provided. Follow up was arranged. Certain parts of this note may have been carried over from prior Ophthalmology notes to maintain accuracy of patient's pertinent medical history and continuity of care. The details were verified and edited as appropriate.      I Brinton Brandel R Mikella Linsley, am scribing for and in the presence of Dr. Dimas Aguas.  09/23/2019 11:55 AM    I, Ashby Dawes. Plotnik, MD, MBA, personally performed the services described in this documentation, as scribed in my presence, and attest that it is accurate and complete. All medical record entries made by the scribe were at my direction and personally dictated by me.  I have reviewed the chart and revised the note as appropriate.  I agree that the the record accurately reflects my personal performance of the history, physical examination, assessment and plan.  I have also  personally directed, reviewed and agree with the discharge instructions.

## 2019-09-24 ENCOUNTER — Other Ambulatory Visit: Payer: Self-pay

## 2019-10-16 ENCOUNTER — Other Ambulatory Visit: Payer: Self-pay

## 2019-10-19 ENCOUNTER — Other Ambulatory Visit: Payer: Self-pay

## 2019-10-19 ENCOUNTER — Other Ambulatory Visit: Payer: Self-pay | Admitting: Ophthalmology

## 2019-10-19 ENCOUNTER — Telehealth: Payer: Self-pay | Admitting: Ophthalmology

## 2019-10-19 NOTE — Telephone Encounter (Signed)
Spoke to Dr. Dimas Aguas and we are going to send a prescription for LotemaxSM to his pharmacy and mailing him a coupon.

## 2019-10-19 NOTE — Telephone Encounter (Addendum)
Patient is calling on behalf of the medication INVELTYS. Patient states he was using a corporate coupon to be able to purchase the medication at a discounted price. The coupon is no longer valid and the patient is unable to afford the prescription at full cost and is wondering if there is an alternative he can be prescribed.

## 2019-10-20 MED ORDER — LOTEPREDNOL ETABONATE 0.38 % OP GEL
1.0000 [drp] | OPHTHALMIC | 4 refills | Status: AC
Start: 2019-10-21 — End: ?

## 2019-10-21 ENCOUNTER — Telehealth: Payer: Self-pay | Admitting: Ophthalmology

## 2019-10-21 NOTE — Telephone Encounter (Signed)
Patient reporting that the directions on Loteprednol Etabonate 0.38% gel stating not to use if wearing contact lens. Wants to know what to do about this. [phone]

## 2019-10-22 NOTE — Telephone Encounter (Signed)
Spoke to patient and let him know it was ok to use the LotemaxSM with his contacts.  He decided he would use it at night after he takes his daily wears out for the night.

## 2019-11-11 ENCOUNTER — Other Ambulatory Visit: Payer: Self-pay

## 2019-12-02 ENCOUNTER — Other Ambulatory Visit: Payer: Self-pay

## 2019-12-02 ENCOUNTER — Other Ambulatory Visit: Payer: Self-pay | Admitting: Ophthalmology

## 2019-12-02 MED ORDER — TACROLIMUS 5 MG/ML IV SOLN *I*
1.0000 [drp] | Freq: Two times a day (BID) | INTRAVENOUS | 3 refills | Status: DC
Start: 2019-12-02 — End: 2020-03-07
  Filled 2019-12-02: qty 5, 9d supply, fill #0
  Filled 2020-01-07: qty 5, 9d supply, fill #1
  Filled 2020-02-01: qty 5, 9d supply, fill #2
  Filled 2020-02-21: qty 5, 9d supply, fill #3

## 2019-12-02 NOTE — Telephone Encounter (Signed)
Pt called to get refill for tacrolimus 0.005% in BSS (PF) ophthalmic solution

## 2019-12-03 ENCOUNTER — Other Ambulatory Visit: Payer: Self-pay

## 2019-12-28 ENCOUNTER — Ambulatory Visit: Payer: BLUE CROSS/BLUE SHIELD | Admitting: Ophthalmology

## 2019-12-28 DIAGNOSIS — H04129 Dry eye syndrome of unspecified lacrimal gland: Secondary | ICD-10-CM

## 2019-12-28 DIAGNOSIS — D89813 Graft-versus-host disease, unspecified: Secondary | ICD-10-CM

## 2019-12-28 DIAGNOSIS — H02886 Meibomian gland dysfunction of left eye, unspecified eyelid: Secondary | ICD-10-CM

## 2019-12-28 DIAGNOSIS — H168 Other keratitis: Secondary | ICD-10-CM

## 2019-12-28 MED ORDER — LOTEMAX SM 0.38 % OP GEL
1.0000 [drp] | Freq: Every day | OPHTHALMIC | 3 refills | Status: DC
Start: 2019-12-28 — End: 2020-01-11

## 2019-12-28 NOTE — Progress Notes (Signed)
Outpatient Visit      Patient name: Henry Ochoa  DOB: 02/04/1956       Age: 64 y.o.  MR#: [id number]    Encounter Date: 12/28/2019    Subjective:      Chief Complaint   Patient presents with    Blurred Vision     HPI      Abdon Moeller is a 64 y.o. male here for 3 month follow up for   Neurotrophic   keratitis, Persistent epi defect, Dry Eye, GVH, MGD. His eyes are "very"   dry towards the end of the day. He doesn't use AT's very often in the   morning but, then by the evening he finds he has to use AT's ~every 15   minutes and finds sometimes he has to keep his eyes closed for a while to   make this go away. He switched to Lotemax gel due to cost but, he found   this make his eyes more dry so he switched back to Inveltys. Reports mild   redness.     He reports he is a daily contact lens wearer, wears them for about 14   hours a day.     Ocular meds:   Artifical tears preservative free every half hour both eyes  Inveltys M/W/F   Tacrolimus 0.005% twice a day both eyes  Ocusoft Hypochlor once daily          Last edited by Roosvelt Harps. on 12/28/2019 11:25 AM. (History)        has a current medication list which includes the following prescription(s): tacrolimus 0.005% in BSS (PF) ophthalmic solution, loteprednol etabonate, tacrolimus (PROTOPIC) 0.03% cmpd ophthalmic solution, moxifloxacin, tacrolimus 0.005% in BSS (PF) ophthalmic solution, inveltys, bromfenac sodium, sodium chloride, ofloxacin, dextran 70-hypromellose, acyclovir, doxycycline hyclate, lisinopril, tramadol, diltiazem, magnesium, vitamin d, metoprolol succinate er, folic acid, apixaban, doxycycline monohydrate, furosemide, spironolactone, cenegermin-bkbj, and methylprednisolone.     is allergic to meropenem.      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 (History)         Objective:     Base Eye Exam     Visual Acuity (Snellen - Linear)       Right Left    Dist cc 20/30 -2/+2 20/25 -1    Correction: Glasses          Pupils       Shape React APD  Right Round Minimal None    Left Round Minimal None   Redness temporally on sclera           Neuro/Psych     Oriented x3: Yes    Mood/Affect: Normal            Slit Lamp and Fundus Exam     External Exam       Right Left    External Normal ocular adnexae, lacrimal gland & drainage, orbits Normal ocular adnexae, lacrimal gland & drainage, orbits          Slit Lamp Exam       Right Left    Lids/Lashes 1.5+ Meibomian gland dysfunction, 2+ Scurf, 2+ Sleeve (both upper and lower lids)  1+ Meibomian gland dysfunction, mild to moderate (2+) subepithelial fibrosis inferiorly, 1+lid debris    Conjunctiva/Sclera 2+ conjunctivochalasis 2+ conjunctivochalasis, 1+ Injection    Cornea 2+ Punctate epithelial erosions and a band about a quarter from the bottom, 1+ mucous 2+ Punctate epithelial erosions and a band about a quarter from the bottom, 1+ mucous, slight subepithelial fibrosis Interim change: resolution of PEE    Anterior Chamber Clear & deep Clear & deep    Iris Normal shape, size, morphology Normal shape, size, morphology    Lens Normal cortex, nucleus, anterior/posterior capsule, clarity Normal cortex, nucleus, anterior/posterior capsule, clarity    Vitreous Clear Clear                                Assessment/Plan:       1. Neurotrophic keratitis     2. 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 Meibomian gland disease of left eye          PLAN:    Write to Dr. Anabel Bene    GVHD/ Neurotrophic keratitis/ Epithelial defect OS: See below.    MGD: Signs and symptoms are consistent with meibomian gland dysfunction / blepharitis / rosacea / blepharoconjunctivitis / evaporative dry eye.  Treatment options include lid hygeine, topical steroids, topical antibiotics, optimized topical antibiotics, topical immunosuppressives, oral omega 3 fatty acids (fish / flax seed oil), oral antibiotics, eyelid scrubs with a product such as Avenova or Ocusoft Hypochlor, and targeted artificial tears.        07/31/18  He is doing much better in terms of symptoms and vision in the left eye  The surface of the epithelial defect has healed  We will watch this carefully and try to prevent a breakdown  Removed contact lens from left eye and applied fluorescein to see if the epithelial defect has remained healed, which ithas  Discussed leaving him out of the lens for now, but will consider this at next visit  Replaced lens with new of the same(Acuvue Oasys 8.8)  We discussed his current treatment regimen and decided maintain his current doses    10/02/18  Epithelium is intact  Central opacity is still present but is fading   I feel that getting him to 20/30 is a reasonable expectation   Discussed considering fitting him for a scleral contact lens to neutralize the irregular astigmatism in the left cornea to improve the quality of vision  Explained the inconvenience of the lens in terms of learning to use and wear it, as well as the possible benefits    02/10/19  Doing well  Central opacity is present but fading  Continue  current medications    Dry eye/ MGD  Will have him try using saline tears as needed to relieve his symptoms,asthere is somemucous on the ocular surface    04/21/19  No scarring of  the conjunctiva,little activeGVHD. The corneal defect on the left eye looks much better-healed. Scar fading. Still with some NV. Patient looks good overall.    06/22/19  There is no scarring of the conjunctiva OU. Will have him decrease the Tacrolimus to once a day OU. Overall he is looking much better.    09/23/19  Stable and doing well on current regimen. He has been unable to obtain 0.005% from a local pharmacy and is experiencing discomfort with use of 0.03%. Switch to Tacrolimus 0.005% twice daily in both eyes.     12/28/19  Improving, looks great today. Pt would like to reduce the time he spends wearing his contacts. Substitute Lotemax sm 1x a day for Inveltys. Increase Tacro to TID      Ocular meds:   Artifical tears preservative free q1Hr left eye   Lotemax sm daily  Tacrolimus 0.005% 3x a day both eyes  Ocusoft Hypochlor once daily     Follow up:   3 months          Patient was seen and examined.  Findings, assessment, and plan were discussed in detail with the patient, who verbalized understanding of and agreement with plan. All questions were answered. The patient was instructed to call or come in if there are any new symptoms or existing symptoms persist or worsen. To call if questions or concerns: [phone], number provided. Follow up was arranged. Certain parts of this note may have been carried over from prior Ophthalmology notes to maintain accuracy of patient's pertinent medical history and continuity of care. The details were verified and edited as appropriate.      I Verdia Kuba Morabito, am scribing for and in the presence of Dr. Dimas Aguas.  12/28/2019 11:31 AM          I, Ashby Dawes. Berit Raczkowski, MD, MBA, personally performed the services described in this documentation, as scribed in my presence, and attest that it is accurate and complete. All medical record entries made by the scribe were at my direction and personally dictated by me.  I have reviewed the chart and revised the note as  appropriate.  I agree that the the record accurately reflects my personal performance of the history, physical examination, assessment and plan.  I have also personally written, directed, reviewed and agree with the discharge instructions.

## 2020-01-07 ENCOUNTER — Other Ambulatory Visit: Payer: Self-pay

## 2020-01-11 ENCOUNTER — Other Ambulatory Visit: Payer: Self-pay | Admitting: Ophthalmology

## 2020-01-11 NOTE — Telephone Encounter (Signed)
Patient states when he puts in the [facility] in the morning he has blurry vision for an hour.  He tried putting them in at night at states he wakes up with OU discharge and crusted shut.  He states he wants to go back to [employer]

## 2020-01-12 ENCOUNTER — Telehealth: Payer: Self-pay | Admitting: Ophthalmology

## 2020-01-12 MED ORDER — LOTEMAX SM 0.38 % OP GEL
1.0000 [drp] | Freq: Every day | OPHTHALMIC | 3 refills | Status: DC
Start: 2020-01-12 — End: 2021-03-28

## 2020-01-12 NOTE — Telephone Encounter (Signed)
Patient states when he puts in the Lotemax in the morning he has blurry vision for an hour.  He tried putting them in at night at states he wakes up with OU discharge and crusted shut.  He states he wants to go back to [facility] or a drop form of Lotemax.  Please advise

## 2020-01-13 ENCOUNTER — Other Ambulatory Visit: Payer: Self-pay | Admitting: Ophthalmology

## 2020-01-13 MED ORDER — LOTEPREDNOL ETABONATE 0.5 % OP SUSP *I*
1.0000 [drp] | Freq: Every day | OPHTHALMIC | 2 refills | Status: DC
Start: 2020-01-13 — End: 2020-08-29

## 2020-01-13 NOTE — Telephone Encounter (Signed)
I'm going to send a prescription for the Lotemax drops to Dr. Dimas Aguas for approval.

## 2020-02-01 ENCOUNTER — Other Ambulatory Visit: Payer: Self-pay

## 2020-02-02 ENCOUNTER — Other Ambulatory Visit: Payer: Self-pay

## 2020-02-21 ENCOUNTER — Other Ambulatory Visit: Payer: Self-pay

## 2020-02-23 ENCOUNTER — Other Ambulatory Visit: Payer: Self-pay

## 2020-03-07 ENCOUNTER — Other Ambulatory Visit: Payer: Self-pay | Admitting: Ophthalmology

## 2020-03-09 ENCOUNTER — Other Ambulatory Visit: Payer: Self-pay

## 2020-03-09 MED ORDER — TACROLIMUS 5 MG/ML IV SOLN *I*
1.0000 [drp] | Freq: Two times a day (BID) | INTRAVENOUS | 3 refills | Status: DC
Start: 2020-03-09 — End: 2020-05-13
  Filled 2020-03-09: qty 5, 9d supply, fill #0
  Filled 2020-03-25: qty 5, 9d supply, fill #1
  Filled 2020-04-11: qty 5, 9d supply, fill #2
  Filled 2020-04-27: qty 5, 9d supply, fill #3

## 2020-03-10 ENCOUNTER — Other Ambulatory Visit: Payer: Self-pay

## 2020-03-25 ENCOUNTER — Other Ambulatory Visit: Payer: Self-pay

## 2020-03-28 ENCOUNTER — Other Ambulatory Visit: Payer: Self-pay

## 2020-03-30 ENCOUNTER — Ambulatory Visit: Payer: BLUE CROSS/BLUE SHIELD | Admitting: Ophthalmology

## 2020-03-30 DIAGNOSIS — H168 Other keratitis: Secondary | ICD-10-CM

## 2020-03-30 DIAGNOSIS — H02886 Meibomian gland dysfunction of left eye, unspecified eyelid: Secondary | ICD-10-CM

## 2020-03-30 DIAGNOSIS — D89813 Graft-versus-host disease, unspecified: Secondary | ICD-10-CM

## 2020-03-30 DIAGNOSIS — H04129 Dry eye syndrome of unspecified lacrimal gland: Secondary | ICD-10-CM

## 2020-03-30 NOTE — Progress Notes (Signed)
Outpatient Visit      Patient name: Henry Ochoa  DOB: 02-Jul-1956       Age: 64 y.o.  MR#: [id number]    Encounter Date: 03/30/2020    Subjective:      Chief Complaint   Patient presents with    Follow-up     Neurotrophic keratitis, Persistent epi defect, Dry Eye, GVH, MGD     HPI     Follow-up      Additional comments: Neurotrophic keratitis, Persistent epi defect, Dry   Eye, GVH, MGD              Comments      Henry Ochoa is a 64 y.o. male is here for a 3 month follow up. Pt notes   recent visit with his optom and given new rx that is working well. ?   Possible use of regen-eyes. Notes comfort about the same, by the end of   the day as before. Pt fairly happy with how his eyes feel ,hoping to   possibly get longer time of relief with new gtts.       Ocular Meds:  Artifical tears preservative free q1Hr left eye   Lotemax suspension every other day ou  Tacrolimus 0.005% 2x a day both eyes  Ocusoft Hypochlor once daily                  Last edited by Elane Fritz, MD on 03/30/2020 12:18 PM. (History)        has a current medication list which includes the following prescription(s): tacrolimus 0.005% in BSS (PF) ophthalmic solution, loteprednol, lotemax sm, loteprednol etabonate, tacrolimus (PROTOPIC) 0.03% cmpd ophthalmic solution, moxifloxacin, tacrolimus 0.005% in BSS (PF) ophthalmic solution, inveltys, bromfenac sodium, sodium chloride, ofloxacin, dextran 70-hypromellose, acyclovir, doxycycline hyclate, lisinopril, tramadol, diltiazem, magnesium, vitamin d, metoprolol succinate er, folic acid, apixaban, doxycycline monohydrate, furosemide, spironolactone, cenegermin-bkbj, and methylprednisolone.     is allergic to meropenem.      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 (History)         Objective:     Base Eye Exam     Visual Acuity (Snellen - Linear)       Right Left    Dist cc 20/25 -1 20/25 -1    Correction: Glasses          Tonometry (Tonopen, 11:14 AM)       Right Left    Pressure  13 12          Pupils       Dark Light Shape React    Right 4 3 Round Slow    Left 4 3 Round Slow          Neuro/Psych     Oriented x3: Yes    Mood/Affect: Normal            Slit Lamp and Fundus Exam     External Exam       Right Left    External Normal ocular adnexae, lacrimal gland & drainage, orbits Normal ocular adnexae, lacrimal gland & drainage, orbits          Slit Lamp Exam       Right Left    Lids/Lashes 1.5+ Meibomian gland dysfunction, 2+ Scurf, 2+ Sleeve (both upper and lower lids)  1+ Meibomian gland dysfunction, mild to moderate (2+) subepithelial fibrosis inferiorly, 1+lid debris    Conjunctiva/Sclera 2+ conjunctivochalasis 2+ conjunctivochalasis, 1+ Injection    Cornea 1+ Punctate epithelial erosions and a band about a quarter from the bottom, 1+ mucous, no filaments 2+ Punctate epithelial erosions and a band about a quarter from the bottom, 1+ mucous, slight subepithelial fibrosis Interim change: resolution of PEEInterim change: 1+ PEE, 1+ filaments    Anterior Chamber Clear & deep Clear & deep    Iris Normal shape, size, morphology Normal shape, size, morphology    Lens Normal cortex, nucleus, anterior/posterior capsule, clarity Normal cortex, nucleus, anterior/posterior capsule,  clarity    Vitreous Clear Clear                              Assessment/Plan:       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 Neurotrophic keratitis     3. Meibomian gland disease of left eye     4. Dry eye        The right eye is doing fairly well.  The left eye still has active GVHD now with filaments.  Options include increasing the potency or frequency of the Tacrolimus or increasing steroid.  He did not tolerate the 0.03 Tacro.  For now, we will go up on the steroid to Lotemax once or twice a day.  If it still looks bad in a month, we will try to compound 0.02% which is also a standard dose.    Check 0.02 from our pharmacy.                         Patient was seen and examined.  Findings, assessment, and plan were discussed in detail with the patient, who verbalized understanding of and agreement with plan. All questions were answered. The patient was instructed to call or come in if there are any new symptoms or existing symptoms persist or worsen. To call if questions or concerns: [phone], number provided. Follow up was arranged. Certain parts of this note may have been carried over from prior Ophthalmology notes to maintain accuracy of patient's pertinent medical history and continuity of care. The details were verified and edited as appropriate.

## 2020-04-11 ENCOUNTER — Other Ambulatory Visit: Payer: Self-pay

## 2020-04-27 ENCOUNTER — Other Ambulatory Visit: Payer: Self-pay

## 2020-04-28 ENCOUNTER — Other Ambulatory Visit: Payer: Self-pay

## 2020-05-13 ENCOUNTER — Other Ambulatory Visit: Payer: Self-pay

## 2020-05-13 ENCOUNTER — Other Ambulatory Visit: Payer: Self-pay | Admitting: Ophthalmology

## 2020-05-13 MED ORDER — TACROLIMUS 5 MG/ML IV SOLN *I*
1.0000 [drp] | Freq: Two times a day (BID) | INTRAVENOUS | 3 refills | Status: DC
Start: 2020-05-13 — End: 2020-08-02
  Filled 2020-05-13: qty 5, 9d supply, fill #0
  Filled 2020-06-02: qty 5, 9d supply, fill #1
  Filled 2020-06-27: qty 5, 9d supply, fill #2
  Filled 2020-07-18: qty 5, 9d supply, fill #3

## 2020-05-16 ENCOUNTER — Other Ambulatory Visit: Payer: Self-pay

## 2020-05-23 ENCOUNTER — Ambulatory Visit: Payer: BLUE CROSS/BLUE SHIELD | Admitting: Ophthalmology

## 2020-05-23 ENCOUNTER — Encounter: Payer: Self-pay | Admitting: Ophthalmology

## 2020-05-23 DIAGNOSIS — H04129 Dry eye syndrome of unspecified lacrimal gland: Secondary | ICD-10-CM

## 2020-05-23 DIAGNOSIS — D89813 Graft-versus-host disease, unspecified: Secondary | ICD-10-CM

## 2020-05-23 DIAGNOSIS — H168 Other keratitis: Secondary | ICD-10-CM

## 2020-05-23 DIAGNOSIS — H02886 Meibomian gland dysfunction of left eye, unspecified eyelid: Secondary | ICD-10-CM

## 2020-05-23 NOTE — Progress Notes (Signed)
Outpatient Visit      Patient name: Henry Ochoa  DOB: 19-Jul-1956       Age: 64 y.o.  MR#: [id number]    Encounter Date: 05/23/2020    Subjective:      Chief Complaint   Patient presents with    Follow-up     Neurotrophic keratitis, Persistent epi defect, Dry Eye, GVH, MGD     HPI     Follow-up      Additional comments: Neurotrophic keratitis, Persistent epi defect, Dry   Eye, GVH, MGD              Comments      2 mo fu h/o Neurotrophic keratitis, Persistent epi defect, Dry Eye, GVH,     MGD  Pt states that things have been pretty much the same. He states that for   the past few days his right eye has had a creamy white discharge and more   tearing. Some itching. Hasn't really noticed and redness. Occasional   discomfort. Vision looks starry when he gets really mucous. L is not   showing any discharge.     Ocular Meds:   Generic PF refresh every 2 hrs OU  Lotemax suspension every other day ou   Tacrolimus 0.005% 2-3 a day both eyes   Ocusoft Hypochlor once daily                 Last edited by Roosvelt Harps. on 05/23/2020 10:02 AM. (History)        has a current medication list which includes the following prescription(s): tacrolimus 0.005% in BSS (PF) ophthalmic solution, loteprednol, loteprednol etabonate, tacrolimus (PROTOPIC) 0.03% cmpd ophthalmic solution, tacrolimus 0.005% in BSS (PF) ophthalmic solution, dextran 70-hypromellose, acyclovir, doxycycline hyclate, lisinopril, tramadol, diltiazem, magnesium, vitamin d, metoprolol succinate er, folic acid, apixaban, doxycycline monohydrate, furosemide, spironolactone, methylprednisolone, lotemax sm, moxifloxacin, inveltys, bromfenac sodium, sodium chloride, ofloxacin, and cenegermin-bkbj.     is allergic to meropenem.      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 (History)         Objective:     Base Eye Exam     Visual Acuity (Snellen - Linear)       Right Left    Dist cc 20/25 20/25    Correction: Glasses          Tonometry (Tonopen, 9:56 AM)       Right Left  Pressure 18 14          Pupils       Dark Light Shape React APD    Right 3 2 Round Minimal None    Left 3 2 Round Minimal None          Neuro/Psych     Oriented x3: Yes    Mood/Affect: Normal            Slit Lamp and Fundus Exam     External Exam       Right Left    External Normal ocular adnexae, lacrimal gland & drainage, orbits Normal ocular adnexae, lacrimal gland & drainage, orbits          Slit Lamp Exam       Right Left    Lids/Lashes 1.5+ Meibomian gland dysfunction, 2+ Scurf, 2+ Sleeve (both upper and lower lids)  1+ Meibomian gland dysfunction, mild to moderate (2+) subepithelial fibrosis inferiorly, 1+lid debris    Conjunctiva/Sclera 2+ conjunctivochalasis 2+ conjunctivochalasis, 1+ Injection    Cornea 1+ Punctate epithelial erosions and a band about a quarter from the bottom, 1+ mucous, few filaments 1+ PEE, 1+ filaments    Anterior Chamber Clear & deep Clear & deep    Iris Normal shape, size, morphology Normal shape, size, morphology    Lens Normal cortex, nucleus, anterior/posterior capsule, clarity Normal cortex, nucleus, anterior/posterior capsule, clarity    Vitreous Clear Clear                              Assessment/Plan:       1. GVHD (graft  versus host disease)     2. Neurotrophic keratitis     3. Meibomian gland disease of left eye     4. Dry eye          Left eye a bit worse.  Keep Tacro the same. Leave Lotemax at MWF OD and increase to Lotemax 1x a day OS    Start Polytrim 4x a day in right eye for 1 week    Signs and symptoms are consistent with meibomian gland dysfunction / blepharitis / rosacea / blepharoconjunctivitis / evaporative dry eye.  Treatment options include lid hygeine, topical steroids, topical antibiotics, optimized topical antibiotics, topical immunosuppressives, oral omega 3 fatty acids (fish / flax seed oil), oral antibiotics, eyelid scrubs with a product such as Avenova or Ocusoft Hypochlor, and targeted artificial tears.      Signs and symptoms are consistent with aqueous deficient dry eye.  Treatment options include general optimization of the ocular surface environment, treatment of concomitant conditions (such as evaporative dry eye, mucus abnormalities, and anatomic eyelid abnormalities), topical steroids for acute exacerbation, topical immunosuppression such as Restasis or Xiidra, enhanced topical immunosuppression such as high dose topical Cyclosporine or Tacrolimus, environmental optimization (moisture chamber occluders, humidifiers, etc), and artificial tears (either optimally preserved or non-preserved).    Progress: Worsening       Current therapy:    Ocular Meds:   Polytrim 4x a day OD  Generic PF refresh every 2 hrs OU  Lotemax suspension MWF OD, daily OS  Tacrolimus 0.005% 2-3x a day both eyes   Ocusoft Hypochlor once daily      fup 2 months         Patient was seen and examined.  Findings, assessment, and plan were discussed in detail with the patient, who verbalized understanding of and agreement with plan. All questions were  answered. The patient was instructed to call or come in if there are any new symptoms or existing symptoms persist or worsen. To call if questions or concerns: [phone], number  provided. Follow up was arranged. Certain parts of this note may have been carried over from prior Ophthalmology notes to maintain accuracy of patient's pertinent medical history and continuity of care. The details were verified and edited as appropriate.      I Verdia Kuba Morabito, am scribing for and in the presence of Dr. Dimas Aguas.  05/23/2020 10:06 AM          I, Ashby Dawes. Ashelynn Marks, MD, MBA, personally performed the services described in this documentation, as scribed in my presence, and attest that it is accurate and complete. All medical record entries made by the scribe were at my direction and personally dictated by me.  I have reviewed the chart and revised the note as appropriate.  I agree that the the record accurately reflects my personal performance of the history, physical examination, assessment and plan.  I have also personally written, directed, reviewed and agree with the discharge instructions.

## 2020-05-24 ENCOUNTER — Telehealth: Payer: Self-pay | Admitting: Ophthalmology

## 2020-05-24 ENCOUNTER — Other Ambulatory Visit: Payer: Self-pay | Admitting: Ophthalmology

## 2020-05-24 MED ORDER — POLYMYXIN B-TRIMETHOPRIM 10000-0.1 UNIT/ML-% OP SOLN *I*
1.0000 [drp] | Freq: Four times a day (QID) | OPHTHALMIC | 0 refills | Status: DC
Start: 2020-05-24 — End: 2020-05-25

## 2020-05-24 NOTE — Telephone Encounter (Signed)
error 

## 2020-05-24 NOTE — Telephone Encounter (Signed)
Patient calling states they did not have the trimethoprim-polymyxin b (POLYTRIM) ophthalmic solution yet and would like to have it sent to [facility] on   [address] [place], [place] [address]

## 2020-05-25 ENCOUNTER — Other Ambulatory Visit: Payer: Self-pay | Admitting: Ophthalmology

## 2020-05-25 NOTE — Telephone Encounter (Signed)
Script sent to Dr. Dimas Aguas.

## 2020-05-26 MED ORDER — POLYMYXIN B-TRIMETHOPRIM 10000-0.1 UNIT/ML-% OP SOLN *I*
1.0000 [drp] | Freq: Four times a day (QID) | OPHTHALMIC | 0 refills | Status: AC
Start: 2020-05-26 — End: 2020-06-02

## 2020-06-02 ENCOUNTER — Other Ambulatory Visit: Payer: Self-pay

## 2020-06-27 ENCOUNTER — Other Ambulatory Visit: Payer: Self-pay

## 2020-07-18 ENCOUNTER — Other Ambulatory Visit: Payer: Self-pay

## 2020-07-19 ENCOUNTER — Other Ambulatory Visit: Payer: Self-pay

## 2020-07-25 ENCOUNTER — Encounter: Payer: Self-pay | Admitting: Ophthalmology

## 2020-07-25 ENCOUNTER — Ambulatory Visit: Payer: BLUE CROSS/BLUE SHIELD | Admitting: Ophthalmology

## 2020-07-25 ENCOUNTER — Other Ambulatory Visit: Payer: Self-pay

## 2020-07-25 VITALS — Ht 74.0 in | Wt 240.0 lb

## 2020-07-25 DIAGNOSIS — H168 Other keratitis: Secondary | ICD-10-CM

## 2020-07-25 DIAGNOSIS — H02886 Meibomian gland dysfunction of left eye, unspecified eyelid: Secondary | ICD-10-CM

## 2020-07-25 DIAGNOSIS — H04129 Dry eye syndrome of unspecified lacrimal gland: Secondary | ICD-10-CM

## 2020-07-25 DIAGNOSIS — D89813 Graft-versus-host disease, unspecified: Secondary | ICD-10-CM

## 2020-07-25 MED ORDER — SODIUM CHLORIDE 0.9 % IN NEBU *I*
3.0000 mL | INHALATION_SOLUTION | RESPIRATORY_TRACT | 3 refills | Status: DC | PRN
Start: 2020-07-25 — End: 2021-04-23

## 2020-07-25 MED ORDER — TACROLIMUS 5 MG/ML IV SOLN *I*
1.0000 [drp] | Freq: Two times a day (BID) | OPHTHALMIC | 5 refills | Status: AC
Start: 2020-07-25 — End: ?
  Filled 2020-07-25: qty 5, 9d supply, fill #0

## 2020-07-25 NOTE — Progress Notes (Signed)
Outpatient Visit      Patient name: Henry Ochoa  DOB: 09-22-1955       Age: 64 y.o.  MR#: [id number]    Encounter Date: 07/25/2020    Subjective:      Chief Complaint   Patient presents with    Follow-up     GVHD, Neurotrophic keratitis, Dry Eye, MGD     HPI     Follow-up      Additional comments: GVHD, Neurotrophic keratitis, Dry Eye, MGD              Comments      2 month follow up of GVHD, Neurotrophic keratitis, MGD and Dry Eye     C/o: patient states his eyes seem to be a little more irritated lately,   redness but no burning sensation. With CLs, seems like the CL is "catching     on something" with the left eye or FBS. Patient denies flashes and   floaters.    Ocular Meds:   Generic PF refresh Q1H in afternoon OU, less in morning  Lotemax suspension MWF OD, daily OS  Tacrolimus 0.005% 2-3x a day both eyes   Ocusoft Hypochlor once daily                  Last edited by Roosvelt Harps. on 07/25/2020 10:04 AM. (History)        has a current medication list which includes the following prescription(s): bumetanide, tamsulosin, tacrolimus 0.005% in BSS (PF) ophthalmic solution, loteprednol, tacrolimus (PROTOPIC) 0.03% cmpd ophthalmic solution, tacrolimus 0.005% in BSS (PF) ophthalmic solution, sodium chloride, dextran 70-hypromellose, acyclovir, lisinopril, tramadol, diltiazem, magnesium, vitamin d, metoprolol succinate er, folic acid, apixaban, furosemide, spironolactone, methylprednisolone, sodium chloride, tacrolimus (PROTOPIC) 0.03% cmpd ophthalmic solution, lotemax sm, loteprednol etabonate, inveltys, bromfenac sodium, doxycycline hyclate, doxycycline monohydrate, and cenegermin-bkbj.     is allergic to meropenem.      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 (History)         Objective:     Base Eye Exam     Visual Acuity (Snellen - Linear)       Right Left    Dist cc 20/20 -2 20/20    Correction: Glasses          Tonometry (Tonopen, 9:44 AM)       Right Left    Pressure 18 15          Pupils       Dark Light APD    Right 3 2 None    Left  3 2 None          Extraocular Movement       Right Left     Full, Ortho Full, Ortho          Neuro/Psych     Oriented x3: Yes    Mood/Affect: Normal            Slit Lamp and Fundus Exam     External Exam       Right Left    External Normal ocular adnexae, lacrimal gland & drainage, orbits Normal ocular adnexae, lacrimal gland & drainage, orbits          Slit Lamp Exam       Right Left    Lids/Lashes 1.5+ Meibomian gland dysfunction, 2+ Scurf, 2+ Sleeve (both upper and lower lids)  1+ Meibomian gland dysfunction, mild to moderate (2+) subepithelial fibrosis inferiorly, 1+lid debris    Conjunctiva/Sclera 2+ conjunctivochalasis 2+ conjunctivochalasis, 1+ Injection    Cornea 1+ Punctate epithelial erosions and a band about a quarter from the bottom, 1+ mucous, few filaments 1/2+ PEE, resolution of filaments, tr central subepithelial opacity    Anterior Chamber Clear & deep Clear & deep    Iris Normal shape, size, morphology Normal shape, size, morphology    Lens Normal cortex, nucleus, anterior/posterior capsule, clarity Normal cortex, nucleus, anterior/posterior capsule, clarity    Vitreous Clear Clear                              Assessment/Plan:       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 Neurotrophic keratitis     3. Meibomian gland disease of left eye     4. Dry eye        Doing better today. Start 0.03%  Tacrolimus.    Signs and symptoms are consistent with meibomian gland dysfunction / blepharitis / rosacea / blepharoconjunctivitis / evaporative dry eye.  Treatment options include lid hygeine, topical steroids, topical antibiotics, optimized topical antibiotics, topical immunosuppressives, oral omega 3 fatty acids (fish / flax seed oil), oral antibiotics, eyelid scrubs with a product such as Avenova or Ocusoft Hypochlor, and targeted artificial tears.      Signs and symptoms are consistent with aqueous deficient dry eye.  Treatment options include general optimization of the ocular surface environment, treatment of concomitant conditions (such as evaporative dry eye, mucus abnormalities, and anatomic eyelid abnormalities), topical steroids for acute exacerbation, topical immunosuppression such as Restasis or Xiidra, enhanced topical immunosuppression such as high dose topical Cyclosporine or Tacrolimus, environmental optimization (moisture chamber occluders, humidifiers, etc), and artificial tears (either optimally preserved or non-preserved).    Progress: Improving    Ocular Meds:   Generic PF refresh every 2 hrs OU  Lotemax suspension MWF OD, daily OS  Tacrolimus 0.03 2-3x a day both eyes   Ocusoft Hypochlor once daily  Sodium chloride vials      fup 3-4 months         Patient was seen and examined.  Findings, assessment, and plan were discussed in detail with the patient, who verbalized understanding of and agreement with plan. All questions were answered. The patient was instructed to call or come in if there are any new symptoms or existing symptoms persist or worsen. To call if questions or concerns: [phone], number provided. Follow up was arranged. Certain parts of this note may have been carried over from prior Ophthalmology notes to  maintain accuracy of patient's pertinent medical history and continuity of care. The details were verified and edited as appropriate.      I Verdia Kuba Morabito, am  scribing for and in the presence of Dr. Dimas Aguas.  07/25/2020 10:17 AM          I, Ashby Dawes. Marely Apgar, MD, MBA, personally performed the services described in this documentation, as scribed in my presence, and attest that it is accurate and complete. All medical record entries made by the scribe were at my direction and personally dictated by me.  I have reviewed the chart and revised the note as appropriate.  I agree that the the record accurately reflects my personal performance of the history, physical examination, assessment and plan.  I have also personally written, directed, reviewed and agree with the discharge instructions.

## 2020-08-01 ENCOUNTER — Telehealth: Payer: Self-pay | Admitting: Ophthalmology

## 2020-08-01 ENCOUNTER — Other Ambulatory Visit: Payer: Self-pay

## 2020-08-01 NOTE — Telephone Encounter (Signed)
Patient states that if Tacrolimus 0.3% is not covered he does need a refill of the 0.005% due to him being out of the medication.  Please advise

## 2020-08-02 ENCOUNTER — Other Ambulatory Visit: Payer: Self-pay | Admitting: Ophthalmology

## 2020-08-02 NOTE — Telephone Encounter (Signed)
Spoke to patient and let him know that we are still waiting on the insurance company for the Tacrolimus 0.3% and I'll send a refill to Dr. Dimas Aguas  For the other.

## 2020-08-04 ENCOUNTER — Other Ambulatory Visit: Payer: Self-pay

## 2020-08-04 MED ORDER — TACROLIMUS 5 MG/ML IV SOLN *I*
1.0000 [drp] | Freq: Two times a day (BID) | INTRAVENOUS | 6 refills | Status: DC
Start: 2020-08-04 — End: 2021-01-10
  Filled 2020-08-04: qty 5, 9d supply, fill #0
  Filled 2020-09-03: qty 5, 9d supply, fill #1
  Filled 2020-09-26: qty 5, 9d supply, fill #2
  Filled 2020-10-18: qty 5, 9d supply, fill #3
  Filled 2020-11-14: qty 5, 9d supply, fill #4
  Filled 2020-11-28: qty 5, 9d supply, fill #5
  Filled 2020-12-19: qty 5, 9d supply, fill #6

## 2020-08-05 ENCOUNTER — Other Ambulatory Visit: Payer: Self-pay

## 2020-08-06 ENCOUNTER — Other Ambulatory Visit: Payer: Self-pay

## 2020-08-09 ENCOUNTER — Other Ambulatory Visit: Payer: Self-pay

## 2020-08-15 ENCOUNTER — Other Ambulatory Visit: Payer: Self-pay

## 2020-08-26 ENCOUNTER — Other Ambulatory Visit: Payer: Self-pay

## 2020-08-29 ENCOUNTER — Other Ambulatory Visit: Payer: Self-pay | Admitting: Ophthalmology

## 2020-08-29 MED ORDER — LOTEPREDNOL ETABONATE 0.5 % OP SUSP *I*
1.0000 [drp] | Freq: Every day | OPHTHALMIC | 2 refills | Status: DC
Start: 2020-08-29 — End: 2021-03-28

## 2020-08-29 NOTE — Telephone Encounter (Signed)
Pt called to get a refill on loteprednol (LOTEMAX) 0.5 % ophthalmic suspension

## 2020-09-03 ENCOUNTER — Other Ambulatory Visit: Payer: Self-pay

## 2020-09-06 ENCOUNTER — Other Ambulatory Visit: Payer: Self-pay

## 2020-09-07 ENCOUNTER — Other Ambulatory Visit: Payer: Self-pay

## 2020-09-08 ENCOUNTER — Other Ambulatory Visit: Payer: Self-pay

## 2020-09-16 ENCOUNTER — Other Ambulatory Visit: Payer: Self-pay

## 2020-09-19 ENCOUNTER — Other Ambulatory Visit: Payer: Self-pay

## 2020-09-26 ENCOUNTER — Other Ambulatory Visit: Payer: Self-pay

## 2020-10-06 ENCOUNTER — Other Ambulatory Visit: Payer: Self-pay

## 2020-10-18 ENCOUNTER — Other Ambulatory Visit: Payer: Self-pay

## 2020-11-14 ENCOUNTER — Other Ambulatory Visit: Payer: Self-pay

## 2020-11-16 ENCOUNTER — Encounter: Payer: Self-pay | Admitting: Ophthalmology

## 2020-11-16 ENCOUNTER — Ambulatory Visit: Payer: BLUE CROSS/BLUE SHIELD | Admitting: Ophthalmology

## 2020-11-16 DIAGNOSIS — H168 Other keratitis: Secondary | ICD-10-CM

## 2020-11-16 DIAGNOSIS — D89813 Graft-versus-host disease, unspecified: Secondary | ICD-10-CM

## 2020-11-16 DIAGNOSIS — H02886 Meibomian gland dysfunction of left eye, unspecified eyelid: Secondary | ICD-10-CM

## 2020-11-16 DIAGNOSIS — H18892 Other specified disorders of cornea, left eye: Secondary | ICD-10-CM

## 2020-11-16 DIAGNOSIS — H04129 Dry eye syndrome of unspecified lacrimal gland: Secondary | ICD-10-CM

## 2020-11-16 NOTE — Progress Notes (Signed)
Outpatient Visit      Patient name: Henry Ochoa  DOB: [birth year]       Age: 65 y.o.  MR#: [id number]    Encounter Date: 11/16/2020    Subjective:      Chief Complaint   Patient presents with    Follow-up     HPI      Henry Ochoa is a 65 y.o. male here for 4 month follow up. He feels his   vision has remained stable since his last exam OU. Denies any pain or   discomfort OU.     Ocular Meds:  Refresh PF every 2 hrs OU  Lotemax suspensionMWF OD, daily OS  Tacrolimus 0.03 2-3xa day both eyes(usually just two times)  Ocusoft Hypochlor once daily  Sodium chloride vials PRN          Last edited by Demetrius Charity on 11/16/2020 10:35 AM. (History)        has a current medication list which includes the following prescription(s): loteprednol, tacrolimus 0.005% in BSS (PF) ophthalmic solution, bumetanide, tamsulosin, sodium chloride, tacrolimus (PROTOPIC) 0.03% cmpd ophthalmic solution, lotemax sm, loteprednol etabonate, tacrolimus (PROTOPIC) 0.03% cmpd ophthalmic solution, tacrolimus 0.005% in BSS (PF) ophthalmic solution, bromfenac sodium, dextran 70-hypromellose, acyclovir, doxycycline hyclate, lisinopril, tramadol, diltiazem, magnesium, vitamin d, metoprolol succinate er, folic acid, apixaban, doxycycline monohydrate, furosemide, spironolactone, methylprednisolone, inveltys, sodium chloride, and cenegermin-bkbj.     is allergic to meropenem.      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 (History)         Objective:     Base Eye Exam     Visual Acuity (Snellen - Linear)       Right Left    Dist cc 20/30 +2 20/30 -1    Correction: Glasses          Tonometry (Tonopen, 10:38 AM)       Right Left    Pressure 13 15          Pupils       Shape React APD    Right Round Brisk None    Left Round Brisk None          Visual Fields       Left Right     Full Full          Extraocular Movement       Right Left     Full, Ortho Full, Ortho  Neuro/Psych     Oriented x3: Yes    Mood/Affect: Normal            Slit Lamp and Fundus Exam     External Exam       Right Left    External Normal ocular adnexae, lacrimal gland & drainage, orbits Normal ocular adnexae, lacrimal gland & drainage, orbits          Slit Lamp Exam       Right Left    Lids/Lashes 1.5+ Meibomian gland dysfunction, 2+ Scurf, 2+ Sleeve (both upper and lower lids)  1+ Meibomian gland dysfunction, mild to moderate (2+) subepithelial fibrosis inferiorly, 1+lid debris    Conjunctiva/Sclera 2+ conjunctivochalasis 2+ conjunctivochalasis, 1+ Injection    Cornea 1+ Punctate epithelial erosions and a band about a quarter from the bottom, 1+ mucous, few filaments 1/2+ PEE, resolution of filaments, tr central subepithelial opacity    Anterior Chamber Clear & deep Clear & deep    Iris Normal shape, size, morphology Normal shape, size, morphology    Lens Normal cortex, nucleus, anterior/posterior capsule, clarity Normal cortex, nucleus, anterior/posterior capsule, clarity    Vitreous Clear Clear                              Assessment/Plan:       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 Neurotrophic keratitis     3. Meibomian gland disease of left eye     4. 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  The risk of the steroid is  Minimal but real and he needs to have his pressure monitored.  He is thinking about going back to Dr. Marina Goodell and we can accommodate whatever he wants. Maintain current tx.  See in 4 months.                     Patient was seen and examined.  Findings, assessment, and plan were discussed in detail with the patient, who verbalized understanding of and agreement with plan. All questions were answered. The patient was instructed to call or come in if there are any new symptoms or existing symptoms persist or worsen. To call if questions or concerns: [phone], number provided. Follow up was arranged. Certain parts of this note may have been carried over from prior Ophthalmology notes to maintain accuracy of patient's pertinent medical history and continuity of care. The details were verified and edited as appropriate.

## 2020-11-28 ENCOUNTER — Other Ambulatory Visit: Payer: Self-pay

## 2020-11-29 ENCOUNTER — Other Ambulatory Visit: Payer: Self-pay

## 2020-12-19 ENCOUNTER — Other Ambulatory Visit: Payer: Self-pay

## 2021-01-10 ENCOUNTER — Other Ambulatory Visit: Payer: Self-pay | Admitting: Ophthalmology

## 2021-01-10 ENCOUNTER — Other Ambulatory Visit: Payer: Self-pay

## 2021-01-10 MED ORDER — TACROLIMUS 5 MG/ML IV SOLN *I*
1.0000 [drp] | Freq: Two times a day (BID) | INTRAVENOUS | 6 refills | Status: DC
Start: 2021-01-10 — End: 2021-05-26
  Filled 2021-01-10: qty 5, 9d supply, fill #0

## 2021-01-10 MED ORDER — TACROLIMUS 5 MG/ML IV SOLN *I*
1.0000 [drp] | Freq: Two times a day (BID) | INTRAVENOUS | 6 refills | Status: DC
Start: 2021-01-10 — End: 2022-02-12
  Filled 2021-01-10: qty 5, 9d supply, fill #0
  Filled 2021-01-30: qty 5, 9d supply, fill #1
  Filled 2021-02-17: qty 5, 9d supply, fill #2
  Filled 2021-03-14: qty 5, 9d supply, fill #3
  Filled 2021-04-01: qty 5, 9d supply, fill #4
  Filled 2021-04-20: qty 5, 9d supply, fill #5
  Filled 2021-05-15: qty 5, 9d supply, fill #6

## 2021-01-11 ENCOUNTER — Other Ambulatory Visit: Payer: Self-pay

## 2021-01-30 ENCOUNTER — Other Ambulatory Visit: Payer: Self-pay

## 2021-02-17 ENCOUNTER — Other Ambulatory Visit: Payer: Self-pay

## 2021-02-21 ENCOUNTER — Other Ambulatory Visit: Payer: Self-pay

## 2021-02-22 ENCOUNTER — Other Ambulatory Visit: Payer: Self-pay

## 2021-03-14 ENCOUNTER — Other Ambulatory Visit: Payer: Self-pay

## 2021-03-17 ENCOUNTER — Telehealth: Payer: Self-pay | Admitting: Cardiology

## 2021-03-17 NOTE — Telephone Encounter (Signed)
New Message:     Patient said that he would like for Dr Stephens Shire to refer or recommend a Primary Doctor for him please.

## 2021-03-22 ENCOUNTER — Ambulatory Visit: Payer: BLUE CROSS/BLUE SHIELD | Admitting: Ophthalmology

## 2021-03-22 ENCOUNTER — Encounter: Payer: Self-pay | Admitting: Ophthalmology

## 2021-03-22 DIAGNOSIS — H02886 Meibomian gland dysfunction of left eye, unspecified eyelid: Secondary | ICD-10-CM

## 2021-03-22 DIAGNOSIS — H18892 Other specified disorders of cornea, left eye: Secondary | ICD-10-CM

## 2021-03-22 DIAGNOSIS — D89813 Graft-versus-host disease, unspecified: Secondary | ICD-10-CM

## 2021-03-22 DIAGNOSIS — H04129 Dry eye syndrome of unspecified lacrimal gland: Secondary | ICD-10-CM

## 2021-03-22 DIAGNOSIS — H168 Other keratitis: Secondary | ICD-10-CM

## 2021-03-22 NOTE — Progress Notes (Signed)
Outpatient Visit      Patient name: Henry Ochoa  DOB: 1956/06/06       Age: 65 y.o.  MR#: [id number]    Encounter Date: 03/22/2021    Subjective:      Chief Complaint   Patient presents with    Follow-up      h/o dry eye, MGD, neurotrophic keratitis, Persistent epithelial defect of cornea     HPI     Follow-up      Additional comments:  h/o dry eye, MGD, neurotrophic keratitis,   Persistent epithelial defect of cornea              Comments      Henry Ochoa is a 65 y.o. male returns for fu h/o dry eye, MGD,   neurotrophic keratitis, Persistent epithelial defect of cornea  Pt states that things have been pretty stable.   Ocular Meds:  Refresh PF every 2 hrs OU  Lotemax suspensionMWF OD, daily OS  Tacrolimus 0.005% 2-3xa day both eyes(usually just two times)  Ocusoft Hypochlor once daily  Sodium chloride vials PRN                 Last edited by Demetrius Charity, COA on 03/22/2021 11:12 AM. (History)        has a current medication list which includes the following prescription(s): tacrolimus 0.005% in BSS (PF) ophthalmic solution, tacrolimus 0.005% in BSS (PF) ophthalmic solution, loteprednol, bumetanide, tamsulosin, sodium chloride, tacrolimus (PROTOPIC) 0.03% cmpd ophthalmic solution, lotemax sm, loteprednol etabonate, tacrolimus (PROTOPIC) 0.03% cmpd ophthalmic solution, tacrolimus 0.005% in BSS (PF) ophthalmic solution, dextran 70-hypromellose, acyclovir, doxycycline hyclate, lisinopril, tramadol, diltiazem, magnesium, vitamin d, metoprolol succinate er, folic acid, apixaban, doxycycline monohydrate, furosemide, spironolactone, methylprednisolone, gabapentin, inveltys, bromfenac sodium, sodium chloride, and cenegermin-bkbj.     is allergic to meropenem.      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 (History)         Objective:     Base Eye Exam     Visual Acuity (Snellen - Linear)       Right Left    Dist cc 20/25-1 20/25-1          Tonometry (Tonopen, 10:47 AM)       Right Left    Pressure 14 15          Pupils       Dark Light Shape React APD    Right 3  2 Round Brisk None    Left 3 2 Round Brisk None          Extraocular Movement       Right Left     Full Full          Neuro/Psych     Oriented x3: Yes    Mood/Affect: Normal            Slit Lamp and Fundus Exam     External Exam       Right Left    External Normal ocular adnexae, lacrimal gland & drainage, orbits Normal ocular adnexae, lacrimal gland & drainage, orbits          Slit Lamp Exam       Right Left    Lids/Lashes 1.5+ Meibomian gland dysfunction, 2+ Scurf, 2+ Sleeve (both upper and lower lids)  1+ Meibomian gland dysfunction, mild to moderate (2+) subepithelial fibrosis inferiorly, 1+lid debris    Conjunctiva/Sclera 2+ conjunctivochalasis 2+ conjunctivochalasis, 1+ Injection    Cornea 1+ Punctate epithelial erosions and a band about a quarter from the bottom, 1+ mucous, few filaments 1/2+ PEE, resolution of filaments, tr central subepithelial opacity, interim change: sm filaments w/o epithelium, opace epithelium, 1+ PEE    Anterior Chamber Clear & deep Clear & deep    Iris Normal shape, size, morphology Normal shape, size, morphology    Lens Normal cortex, nucleus, anterior/posterior capsule, clarity Normal  cortex, nucleus, anterior/posterior capsule, clarity    Vitreous Clear Clear                              Assessment/Plan:       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 Neurotrophic keratitis     3. Meibomian gland disease of left eye     4. 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 Cornea is stable on current drop regimen. His IOP continues to be acceptable. Discussed risk of IOP elevation with topical steroid use. Patient reports he was unable to switch to the Tacrolimus 0.03% due to insurance. He is currently using 0.005%. Continue current drop regimen. Return for any concerns.     Signs and symptoms are consistent with aqueous deficient dry eye.  Treatment options include general optimization of the ocular surface environment, treatment of concomitant conditions (such as evaporative dry eye, mucus abnormalities, and anatomic eyelid abnormalities), topical steroids for acute exacerbation, topical immunosuppression such as Restasis or Xiidra, enhanced topical immunosuppression such as high dose topical Cyclosporine or Tacrolimus, environmental optimization (moisture chamber occluders, humidifiers, etc), and artificial tears (either optimally preserved or non-preserved).    Signs and symptoms are consistent with meibomian gland dysfunction / blepharitis / rosacea / blepharoconjunctivitis / evaporative dry eye.  Treatment options include lid hygeine, topical steroids, topical antibiotics, optimized topical antibiotics, topical immunosuppressives, oral omega 3 fatty acids (fish / flax seed oil), oral antibiotics, eyelid scrubs with a product such as Avenova or Ocusoft Hypochlor, and targeted artificial tears.        Ocular medications:  Refresh PF every 2 hours while awake, both eyes  Lotemax suspensionMonday/ Wednesday/ Friday, right eye/ daily, left eye  Tacrolimus 0.005% 2-3 times daily, both eyes  Ocusoft Hypochlor once  daily  Sodium chloride vials as needed (ok to use  with contact lenses)    Follow up:  PRN.  Will see Dr. Marina Goodell       Patient was seen and examined.  Findings, assessment, and plan were discussed in detail with the patient, who verbalized understanding of and agreement with plan. All questions were answered. The patient was instructed to call or come in if there are any new symptoms or existing symptoms persist or worsen. To call if questions or concerns: [phone], number provided. Follow up was arranged. Certain parts of this note may have been carried over from prior Ophthalmology notes to maintain accuracy of patient's pertinent medical history and continuity of care. The details were verified and edited as appropriate.      I Alyssa R Kimanski, COA, am scribing for and in the presence of Dr. Dimas Aguas.  03/22/2021 11:16 AM     I, Ashby Dawes. Olga Seyler, MD, MBA, personally performed the services described in this documentation, as scribed in my presence, and attest that it is accurate and complete. All medical record entries made by the scribe were at my direction and personally dictated by me.  I have reviewed the chart and revised the note as appropriate.  I agree that the the record accurately reflects my personal performance of the history, physical examination, assessment and plan.  I have also personally written, directed, reviewed and agree with the discharge instructions.

## 2021-03-28 ENCOUNTER — Other Ambulatory Visit: Payer: Self-pay | Admitting: Ophthalmology

## 2021-03-28 MED ORDER — LOTEMAX SM 0.38 % OP GEL
1.0000 [drp] | Freq: Every day | OPHTHALMIC | 4 refills | Status: AC
Start: 2021-03-28 — End: ?

## 2021-03-28 MED ORDER — LOTEPREDNOL ETABONATE 0.5 % OP SUSP *I*
1.0000 [drp] | Freq: Every day | OPHTHALMIC | 2 refills | Status: AC
Start: 2021-03-28 — End: ?

## 2021-04-01 ENCOUNTER — Other Ambulatory Visit: Payer: Self-pay

## 2021-04-03 ENCOUNTER — Other Ambulatory Visit: Payer: Self-pay

## 2021-04-20 ENCOUNTER — Other Ambulatory Visit: Payer: Self-pay

## 2021-04-21 ENCOUNTER — Other Ambulatory Visit: Payer: Self-pay | Admitting: Ophthalmology

## 2021-05-15 ENCOUNTER — Other Ambulatory Visit: Payer: Self-pay

## 2021-05-26 ENCOUNTER — Other Ambulatory Visit: Payer: Self-pay

## 2021-05-26 ENCOUNTER — Other Ambulatory Visit: Payer: Self-pay | Admitting: Ophthalmology

## 2021-05-26 MED ORDER — TACROLIMUS 5 MG/ML IV SOLN *I*
1.0000 [drp] | Freq: Two times a day (BID) | INTRAVENOUS | 6 refills | Status: DC
Start: 2021-05-26 — End: 2021-09-29
  Filled 2021-05-26: qty 5, 9d supply, fill #0
  Filled 2021-06-19: qty 5, 9d supply, fill #1
  Filled 2021-07-06: qty 5, 9d supply, fill #2
  Filled 2021-07-10: qty 5, 9d supply, fill #3
  Filled 2021-08-07: qty 5, 9d supply, fill #4
  Filled 2021-08-22: qty 5, 9d supply, fill #5
  Filled 2021-09-12: qty 5, 9d supply, fill #6

## 2021-05-26 NOTE — Telephone Encounter (Signed)
Patient called to get a refill on a medication called Tacrolimus 0.005% solution. Patient verified pharmacy for [facility].

## 2021-05-29 ENCOUNTER — Other Ambulatory Visit: Payer: Self-pay

## 2021-06-19 ENCOUNTER — Other Ambulatory Visit: Payer: Self-pay

## 2021-07-06 ENCOUNTER — Other Ambulatory Visit: Payer: Self-pay

## 2021-07-10 ENCOUNTER — Other Ambulatory Visit: Payer: Self-pay

## 2021-07-17 ENCOUNTER — Other Ambulatory Visit: Payer: Self-pay

## 2021-08-07 ENCOUNTER — Other Ambulatory Visit: Payer: Self-pay

## 2021-08-22 ENCOUNTER — Other Ambulatory Visit: Payer: Self-pay

## 2021-09-12 ENCOUNTER — Other Ambulatory Visit: Payer: Self-pay

## 2021-09-29 ENCOUNTER — Other Ambulatory Visit: Payer: Self-pay | Admitting: Ophthalmology

## 2021-10-01 ENCOUNTER — Other Ambulatory Visit: Payer: Self-pay

## 2021-10-01 MED ORDER — TACROLIMUS 5 MG/ML IV SOLN *I*
1.0000 [drp] | Freq: Two times a day (BID) | INTRAVENOUS | 6 refills | Status: DC
Start: 2021-10-01 — End: 2022-02-12
  Filled 2021-10-01: qty 5, 9d supply, fill #0
  Filled 2021-10-23: qty 5, 9d supply, fill #1
  Filled 2021-11-13: qty 5, 9d supply, fill #2
  Filled 2021-12-04: qty 5, 9d supply, fill #3
  Filled 2021-12-18: qty 5, 9d supply, fill #4
  Filled 2022-01-07: qty 5, 9d supply, fill #5
  Filled 2022-01-23: qty 5, 9d supply, fill #6

## 2021-10-02 ENCOUNTER — Other Ambulatory Visit: Payer: Self-pay

## 2021-10-09 ENCOUNTER — Emergency Department (HOSPITAL_COMMUNITY)
Admission: EM | Admit: 2021-10-09 | Discharge: 2021-10-09 | Disposition: A | Discharge status: Home / Self Care | Payer: Medicare Other | Attending: Emergency Medicine | Admitting: Emergency Medicine

## 2021-10-09 ENCOUNTER — Encounter (HOSPITAL_COMMUNITY): Payer: Self-pay | Admitting: Emergency Medicine

## 2021-10-09 ENCOUNTER — Emergency Department (HOSPITAL_COMMUNITY): Payer: Medicare Other

## 2021-10-09 DIAGNOSIS — W228XXA Striking against or struck by other objects, initial encounter: Secondary | ICD-10-CM | POA: Insufficient documentation

## 2021-10-09 DIAGNOSIS — S060X0A Concussion without loss of consciousness, initial encounter: Secondary | ICD-10-CM

## 2021-10-09 DIAGNOSIS — Z23 Encounter for immunization: Secondary | ICD-10-CM | POA: Diagnosis not present

## 2021-10-09 DIAGNOSIS — S0990XA Unspecified injury of head, initial encounter: Secondary | ICD-10-CM | POA: Diagnosis present

## 2021-10-09 DIAGNOSIS — S0101XA Laceration without foreign body of scalp, initial encounter: Secondary | ICD-10-CM | POA: Diagnosis not present

## 2021-10-09 MED ORDER — TETANUS-DIPHTH-ACELL PERTUSSIS 5-2.5-18.5 LF-MCG/0.5 IM SUSY
0.5000 mL | PREFILLED_SYRINGE | Freq: Once | INTRAMUSCULAR | Status: AC
  Administered 2021-10-09: 0.5 mL via INTRAMUSCULAR
  Filled 2021-10-09: qty 0.5

## 2021-10-09 MED ORDER — LIDOCAINE HCL (PF) 1 % IJ SOLN
20.0000 mL | Freq: Once | INTRAMUSCULAR | Status: DC
  Filled 2021-10-09: qty 20

## 2021-10-09 NOTE — ED Provider Notes (Signed)
[facility] EMERGENCY DEPARTMENT Provider Note  History   Chief Complaint  Patient presents with   Head Injury   The history is provided by the patient.  Illness Location:  Head / scalp Quality:  [place] s/p trailer hitch hit head Severity:  Moderate Onset quality:  Sudden Duration: PTA. Timing:  Constant Progression:  Unchanged Chronicity:  New Context:  See MDM Associated symptoms: no abdominal pain, no chest pain, no cough, no ear pain, no fever, no rash, no shortness of breath and no sore throat     Past Medical History:  Diagnosis Date   Heart attack ([place])    Hyperlipidemia     Social History   Tobacco Use   Smoking status: Never   Smokeless tobacco: Never  Vaping Use   Vaping Use: Never used  Substance Use Topics   Alcohol use: Yes    Comment: rare   Drug use: No     Family History  Problem Relation Age of Onset   Cancer Mother    Diabetes Mother    Heart disease Father     Review of Systems  Constitutional:  Negative for chills and fever.  HENT:  Negative for ear pain and sore throat.   Eyes:  Negative for pain and visual disturbance.  Respiratory:  Negative for cough and shortness of breath.   Cardiovascular:  Negative for chest pain and palpitations.  Gastrointestinal:  Negative for abdominal pain.  Endocrine: Negative.   Genitourinary:  Negative for dysuria and hematuria.  Musculoskeletal:  Negative for arthralgias and back pain.  Skin:  Negative for color change and rash.  Allergic/Immunologic: Negative.   Neurological:  Negative for dizziness and syncope.  Hematological: Negative.   Psychiatric/Behavioral: Negative.    All other systems reviewed and are negative.   Physical Exam   Today's Vitals   10/09/21 1215 10/09/21 1229 10/09/21 1456 10/09/21 1914  BP:  (!) 146/75 140/80   Pulse:  (!) 55 (!) 54   Resp:  17 17   Temp:  98.4 F (36.9 C) 98 F (36.7 C)   TempSrc:  Oral Oral   SpO2:  95% 100%   PainSc: 4     0-No pain     Physical Exam:  General: No distress, appears well hydrated and well nourished   Head: Normocephalic No skull depressions 2 scalp lacerations, hemostatic No conjunctival hemorrhage No periorbital ecchymoses, [place] Eyes, or [place] bilaterally Ears atraumatic No nasal septal deviation or hematoma  PERRL, EOMI, sclera anicteric. Mucus membranes moist.        Neck: Supple, trachea midline No TTP over midline cervical spine, no step offs or deformities.    Cardiovascular: RATE: 54 RHYTHM: regular 2+ radial, femoral, DP pulses bilaterally   Respiratory/Chest Wall: Lungs clear to auscultation bilaterally Clavicles stable to compression Chest stable to AP and lateral compression No chest wall TTP No crepitus   Extremities: Warm, well perfused. No gross deformities.    Gastrointestinal: Abdomen soft/nontender FAST performed: No   Neurologic: No cranial nerve deficits Bilateral upper and lower extremity motor and sensory intact Coordination intact Able to ambulate without difficulty   Genitourinary: Normal male   Skin: N/A   Glasgow Coma Scale: GCS 15   Rectal: Deferred   Spine: No TTP along midline C/T/L spine, no step offs or deformities    Other:      ED Course  .Marland KitchenLaceration Repair  Date/Time: 10/10/2021 2:30 PM Performed by: Wynetta Fines, MD Authorized by: Billy Fischer,  Junie Panning, MD   Consent:    Consent obtained:  Verbal   Consent given by:  Patient   Risks, benefits, and alternatives were discussed: yes     Risks discussed:  Infection and pain   Alternatives discussed:  No treatment Universal protocol:    Patient identity confirmed:  Verbally with patient and arm band Anesthesia:    Anesthesia method:  Local infiltration   Local anesthetic:  Lidocaine 1% w/o epi Laceration details:    Location:  Scalp   Scalp location:  Mid-scalp   Length (cm):  5 Pre-procedure details:    Preparation:  Patient was prepped and draped in  usual sterile fashion Exploration:    Hemostasis achieved with:  Direct pressure   Wound extent: no underlying fracture noted   Treatment:    Area cleansed with:  Saline and chlorhexidine   Amount of cleaning:  Standard   Irrigation solution:  Sterile saline   Irrigation volume:  100   Irrigation method:  Pressure wash Skin repair:    Repair method:  Sutures   Suture size:  3-0   Suture material:  Prolene   Suture technique:  Simple interrupted   Number of sutures:  8 Approximation:    Approximation:  Close Repair type:    Repair type:  Simple Post-procedure details:    Dressing:  Antibiotic ointment   Procedure completion:  Tolerated       Medical Decision Making:  Andre Zimmerman is a 66 y.o. male w/ h/o HLD, s/p ACDF who p/w head injury.   Failed to latch trailer hitch and it fell striking him in the head at work Injury occurred approximately at noon. It has been 4.5 hours since the injury. No LOC No emesis No AMS There is no evidence of basal skull fracture or of significant head trauma.  No palpable skull fracture or signs of basilar skull fracture evident.  No temporal or occipital hematoma. However he has 2 scalp lacerations as shown above and physical examination.  Does not appear to involve the g[place].  Will obtain CTH to eval for fx or IC injury.   ER provider interpretation of Imaging / Radiology:  CT head: No acute intracranial abnormality  ER provider interpretation of EKG:  Not indicated  ER provider interpretation of Labs:  Not indicated  Key medications administered in the ER:  Medications  Tdap (BOOSTRIX) injection 0.5 mL (0.5 mLs Intramuscular Given 10/09/21 1551)   Diagnoses considered: Do not suspect SDH, EDH, SAH, or other ICH, nor do I suspect other intracranial etiology as there are no focal neurologic findings. I have considered retrobulbar hematoma and orbital fracture. Patient has no peri- or retro-orbital pain. EOMI without entrapment.  Negative paresthesias of face, no proptosis, nor is there change in vision.  Consulted: None  Key discharge instructions: Spoke to patient at bedside, all questions were answered at this time, close return precautions given, and patient voiced understanding and agreement with plan. Patient discharged in stable condition. Agreeable to f/u in 7d for suture removal. Infectious precautions given  Patient seen in conjunction with Dr. Inez Catalina medical dictation software was used in the creation of this note.   Electronically signed by: Wynetta Fines, MD on 10/10/2021 at 2:32 PM  Clinical Impression:  1. Injury of head, initial encounter   2. Concussion without loss of consciousness, initial encounter   3. Laceration of scalp, initial encounter     Dispo: Discharge    Wynetta Fines, MD 10/10/21 1436  Gareth Morgan, MD 10/11/21 [phone]

## 2021-10-09 NOTE — ED Provider Triage Note (Signed)
Emergency Medicine Provider Triage Evaluation Note  Andre Zimmerman , a 66 y.o. male  was evaluated in triage.  Pt complains of head injury at work about 10 minutes prior to ER arrival.  Patient was putting a metal gate that fell back on his head.  No loss of consciousness.  No other trauma noted.  Unknown last tetanus. He is not on chronic anticoagulation  Review of Systems  Positive: Head injury Negative: Numbness, weakness, loss consciousness  Physical Exam  There were no vitals taken for this visit. Gen:   Awake, no distress   Resp:  Normal effort  MSK:   Moves extremities without difficulty  Other:  2 linear lacerations on the scalp, one approximately 2.5cm, the other approximately 4cm, bleeding is controlled  Medical Decision Making  Medically screening exam initiated at 12:17 PM.  Appropriate orders placed.  Andre Zimmerman was informed that the remainder of the evaluation will be completed by another provider, this initial triage assessment does not replace that evaluation, and the importance of remaining in the ED until their evaluation is complete.     Andre Zimmerman T, PA-C 10/09/21 1218

## 2021-10-09 NOTE — Discharge Instructions (Addendum)
You have a total of 8 sutures in your head.  Please follow-up with your regular doctor in 7 days to have the sutures removed.  Please return to the emergency department if you develop fever, redness surrounding your laceration site, or purulent drainage from the site this could be a sign of infection.  We updated your tetanus shot in the ED.  Please see the attached reading regarding laceration care and concussion information.  For pain control we recommend alternating Tylenol with ibuprofen around-the-clock over the next few days for baseline pain control.  You may take each of these medications every 6 hours.  I recommend scheduling them in the following ways: Tylenol 1000 mg, then 3 hours later ibuprofen 800 mg, then 3 hours later an additional dose of Tylenol 1000 mg, then 3 hours later an additional dose of ibuprofen 800 mg.  Continue alternating fashion.

## 2021-10-09 NOTE — ED Triage Notes (Signed)
Patient here for evaluation of head injury after a metal gate hit the top of his head earlier today resulting in approximately 5cm laceration, hemorrhage controlled. Denies LOC, no anticoagulants. Pupils equal, round, and reactive to light. Patient is alert, oriented, and in no apparent distress at this time.

## 2021-10-23 ENCOUNTER — Other Ambulatory Visit: Payer: Self-pay

## 2021-11-06 ENCOUNTER — Other Ambulatory Visit: Payer: Self-pay | Admitting: Ophthalmology

## 2021-11-13 ENCOUNTER — Other Ambulatory Visit: Payer: Self-pay

## 2021-12-04 ENCOUNTER — Other Ambulatory Visit: Payer: Self-pay

## 2021-12-18 ENCOUNTER — Other Ambulatory Visit: Payer: Self-pay

## 2022-01-07 ENCOUNTER — Other Ambulatory Visit: Payer: Self-pay

## 2022-01-08 ENCOUNTER — Other Ambulatory Visit: Payer: Self-pay

## 2022-01-23 ENCOUNTER — Other Ambulatory Visit: Payer: Self-pay

## 2022-02-12 ENCOUNTER — Other Ambulatory Visit: Payer: Self-pay

## 2022-02-12 ENCOUNTER — Other Ambulatory Visit: Payer: Self-pay | Admitting: Ophthalmology

## 2022-02-13 ENCOUNTER — Other Ambulatory Visit: Payer: Self-pay

## 2022-02-13 MED ORDER — TACROLIMUS 5 MG/ML IV SOLN *I*
1.0000 [drp] | Freq: Two times a day (BID) | INTRAVENOUS | 6 refills | Status: DC
Start: 2022-02-13 — End: 2022-06-11
  Filled 2022-02-13: qty 5, 9d supply, fill #0
  Filled 2022-02-26: qty 5, 9d supply, fill #1
  Filled 2022-03-19: qty 5, 9d supply, fill #2
  Filled 2022-04-03: qty 5, 9d supply, fill #3
  Filled 2022-04-30: qty 5, 9d supply, fill #4
  Filled 2022-05-20: qty 5, 9d supply, fill #5
  Filled 2022-06-04: qty 5, 9d supply, fill #6

## 2022-02-13 MED ORDER — TACROLIMUS 5 MG/ML IV SOLN *I*
1.0000 [drp] | Freq: Two times a day (BID) | INTRAVENOUS | 6 refills | Status: AC
Start: 2022-02-13 — End: ?
  Filled 2022-02-13: qty 5, 9d supply, fill #0

## 2022-02-26 ENCOUNTER — Other Ambulatory Visit: Payer: Self-pay

## 2022-03-19 ENCOUNTER — Other Ambulatory Visit: Payer: Self-pay

## 2022-03-21 ENCOUNTER — Other Ambulatory Visit: Payer: Self-pay

## 2022-04-03 ENCOUNTER — Other Ambulatory Visit: Payer: Self-pay

## 2022-04-17 ENCOUNTER — Other Ambulatory Visit: Payer: Self-pay | Admitting: Internal Medicine

## 2022-04-17 DIAGNOSIS — E785 Hyperlipidemia, unspecified: Secondary | ICD-10-CM

## 2022-04-24 ENCOUNTER — Ambulatory Visit
Admission: RE | Admit: 2022-04-24 | Discharge: 2022-04-24 | Disposition: A | Discharge status: Home / Self Care | Payer: No Typology Code available for payment source | Source: Ambulatory Visit | Attending: Internal Medicine | Admitting: Internal Medicine

## 2022-04-24 DIAGNOSIS — E785 Hyperlipidemia, unspecified: Secondary | ICD-10-CM

## 2022-04-30 ENCOUNTER — Other Ambulatory Visit: Payer: Self-pay

## 2022-05-02 ENCOUNTER — Other Ambulatory Visit: Payer: Self-pay

## 2022-05-20 ENCOUNTER — Other Ambulatory Visit: Payer: Self-pay

## 2022-05-21 ENCOUNTER — Other Ambulatory Visit: Payer: Self-pay

## 2022-06-04 ENCOUNTER — Other Ambulatory Visit: Payer: Self-pay

## 2022-06-11 ENCOUNTER — Other Ambulatory Visit: Payer: Self-pay | Admitting: Ophthalmology

## 2022-06-12 ENCOUNTER — Other Ambulatory Visit: Payer: Self-pay

## 2022-06-12 MED ORDER — TACROLIMUS 5 MG/ML IV SOLN *I*
1.0000 [drp] | Freq: Two times a day (BID) | INTRAVENOUS | 6 refills | Status: AC
Start: 2022-06-12 — End: ?
  Filled 2022-06-12: qty 5, 9d supply, fill #0
  Filled 2022-07-02: qty 5, 9d supply, fill #1
  Filled 2022-07-23: qty 5, 9d supply, fill #2
  Filled 2022-08-06: qty 5, 9d supply, fill #3
  Filled 2022-08-23: qty 5, 9d supply, fill #4

## 2022-06-13 ENCOUNTER — Other Ambulatory Visit: Payer: Self-pay

## 2022-07-02 ENCOUNTER — Other Ambulatory Visit: Payer: Self-pay

## 2022-07-23 ENCOUNTER — Other Ambulatory Visit: Payer: Self-pay

## 2022-08-06 ENCOUNTER — Other Ambulatory Visit: Payer: Self-pay

## 2022-08-23 ENCOUNTER — Other Ambulatory Visit: Payer: Self-pay

## 2022-08-23 MED ORDER — TACROLIMUS 5 MG/ML IV SOLN *I*
INTRAVENOUS | 6 refills | Status: AC
Start: 2022-08-23 — End: ?
  Filled 2022-08-23: qty 5, 9d supply, fill #0
  Filled 2022-09-10: qty 5, 9d supply, fill #1
  Filled 2022-10-08: qty 5, 9d supply, fill #2
  Filled 2022-10-29: qty 5, 9d supply, fill #3
  Filled 2022-11-12: qty 5, 9d supply, fill #4
  Filled 2022-12-03: qty 5, 9d supply, fill #5
  Filled 2022-12-17: qty 5, 9d supply, fill #6

## 2022-09-10 ENCOUNTER — Other Ambulatory Visit: Payer: Self-pay

## 2022-10-08 ENCOUNTER — Other Ambulatory Visit: Payer: Self-pay

## 2022-10-29 ENCOUNTER — Other Ambulatory Visit: Payer: Self-pay

## 2022-10-30 ENCOUNTER — Other Ambulatory Visit: Payer: Self-pay

## 2022-11-12 ENCOUNTER — Other Ambulatory Visit: Payer: Self-pay

## 2022-12-03 ENCOUNTER — Other Ambulatory Visit: Payer: Self-pay

## 2022-12-17 ENCOUNTER — Other Ambulatory Visit: Payer: Self-pay

## 2022-12-18 ENCOUNTER — Other Ambulatory Visit: Payer: Self-pay

## 2023-01-01 ENCOUNTER — Other Ambulatory Visit: Payer: Self-pay

## 2023-01-03 ENCOUNTER — Other Ambulatory Visit: Payer: Self-pay

## 2023-01-03 MED ORDER — TACROLIMUS 5 MG/ML IV SOLN *I*
INTRAVENOUS | 6 refills | Status: AC
Start: 2023-01-03 — End: ?
  Filled 2023-01-03: qty 5, 9d supply, fill #0
  Filled 2023-01-21: qty 5, 9d supply, fill #1
  Filled 2023-02-11 (×2): qty 5, 9d supply, fill #2
  Filled 2023-03-04 (×2): qty 5, 9d supply, fill #3
  Filled 2023-03-18: qty 5, 9d supply, fill #4
  Filled 2023-04-08 (×2): qty 5, 9d supply, fill #5
  Filled 2023-04-23 (×2): qty 5, 9d supply, fill #6

## 2023-01-04 ENCOUNTER — Other Ambulatory Visit: Payer: Self-pay

## 2023-01-21 ENCOUNTER — Other Ambulatory Visit: Payer: Self-pay

## 2023-02-02 ENCOUNTER — Other Ambulatory Visit: Payer: Self-pay

## 2023-02-11 ENCOUNTER — Other Ambulatory Visit: Payer: Self-pay

## 2023-02-12 ENCOUNTER — Other Ambulatory Visit: Payer: Self-pay

## 2023-03-04 ENCOUNTER — Other Ambulatory Visit: Payer: Self-pay

## 2023-03-18 ENCOUNTER — Other Ambulatory Visit: Payer: Self-pay

## 2023-04-08 ENCOUNTER — Other Ambulatory Visit: Payer: Self-pay

## 2023-04-23 ENCOUNTER — Other Ambulatory Visit: Payer: Self-pay

## 2023-04-30 ENCOUNTER — Other Ambulatory Visit: Payer: Self-pay

## 2023-04-30 MED ORDER — TACROLIMUS 5 MG/ML IV SOLN *I*
INTRAVENOUS | 6 refills | Status: DC
Start: 2023-04-30 — End: 2023-09-23
  Filled 2023-04-30: qty 5, 9d supply, fill #0
  Filled 2023-05-21 (×2): qty 5, 9d supply, fill #1
  Filled 2023-06-17: qty 5, 9d supply, fill #2
  Filled 2023-07-02: qty 5, 9d supply, fill #3
  Filled 2023-07-22: qty 5, 9d supply, fill #4
  Filled 2023-08-13: qty 5, 9d supply, fill #5
  Filled 2023-09-03 (×2): qty 5, 9d supply, fill #6

## 2023-05-21 ENCOUNTER — Other Ambulatory Visit: Payer: Self-pay

## 2023-06-17 ENCOUNTER — Other Ambulatory Visit: Payer: Self-pay

## 2023-07-02 ENCOUNTER — Other Ambulatory Visit: Payer: Self-pay

## 2023-07-22 ENCOUNTER — Other Ambulatory Visit: Payer: Self-pay

## 2023-07-23 ENCOUNTER — Other Ambulatory Visit: Payer: Self-pay

## 2023-08-13 ENCOUNTER — Other Ambulatory Visit: Payer: Self-pay

## 2023-08-15 ENCOUNTER — Other Ambulatory Visit: Payer: Self-pay

## 2023-09-03 ENCOUNTER — Other Ambulatory Visit: Payer: Self-pay

## 2023-09-23 ENCOUNTER — Other Ambulatory Visit: Payer: Self-pay

## 2023-09-23 MED ORDER — TACROLIMUS 5 MG/ML IV SOLN *I*
INTRAVENOUS | 6 refills | Status: AC
Start: 2023-09-23 — End: ?
  Filled 2023-09-23: qty 5, 9d supply, fill #0
  Filled 2023-10-08 (×2): qty 5, 9d supply, fill #1
  Filled 2023-11-04: qty 5, 9d supply, fill #2
  Filled 2023-11-25 (×2): qty 5, 9d supply, fill #3
  Filled 2023-12-16: qty 5, 9d supply, fill #4
  Filled 2023-12-30: qty 5, 9d supply, fill #5
  Filled 2024-01-20: qty 5, 9d supply, fill #6

## 2023-10-08 ENCOUNTER — Other Ambulatory Visit: Payer: Self-pay

## 2023-11-04 ENCOUNTER — Other Ambulatory Visit: Payer: Self-pay

## 2023-11-25 ENCOUNTER — Other Ambulatory Visit: Payer: Self-pay

## 2023-12-16 ENCOUNTER — Other Ambulatory Visit: Payer: Self-pay

## 2023-12-30 ENCOUNTER — Other Ambulatory Visit: Payer: Self-pay

## 2024-01-20 ENCOUNTER — Other Ambulatory Visit: Payer: Self-pay

## 2024-02-03 ENCOUNTER — Other Ambulatory Visit: Payer: Self-pay

## 2024-02-03 MED ORDER — TACROLIMUS 5 MG/ML IV SOLN *I*
INTRAVENOUS | 6 refills | Status: DC
Start: 2024-02-03 — End: 2024-06-15
  Filled 2024-02-03: qty 5, 9d supply, fill #0
  Filled 2024-02-24: qty 5, 9d supply, fill #1
  Filled 2024-03-09: qty 5, 9d supply, fill #2
  Filled 2024-03-31: qty 5, 9d supply, fill #3
  Filled 2024-04-20 (×2): qty 5, 9d supply, fill #4
  Filled 2024-05-11: qty 5, 9d supply, fill #5
  Filled 2024-06-01: qty 5, 9d supply, fill #6

## 2024-02-04 ENCOUNTER — Other Ambulatory Visit: Payer: Self-pay

## 2024-02-24 ENCOUNTER — Other Ambulatory Visit: Payer: Self-pay

## 2024-02-25 ENCOUNTER — Other Ambulatory Visit: Payer: Self-pay

## 2024-03-09 ENCOUNTER — Other Ambulatory Visit: Payer: Self-pay

## 2024-03-31 ENCOUNTER — Other Ambulatory Visit: Payer: Self-pay

## 2024-04-20 ENCOUNTER — Other Ambulatory Visit: Payer: Self-pay

## 2024-04-21 ENCOUNTER — Other Ambulatory Visit: Payer: Self-pay

## 2024-04-22 ENCOUNTER — Other Ambulatory Visit: Payer: Self-pay

## 2024-05-11 ENCOUNTER — Other Ambulatory Visit: Payer: Self-pay

## 2024-06-01 ENCOUNTER — Other Ambulatory Visit: Payer: Self-pay

## 2024-06-15 ENCOUNTER — Other Ambulatory Visit: Payer: Self-pay

## 2024-06-15 MED ORDER — TACROLIMUS 5 MG/ML IV SOLN *I*
INTRAVENOUS | 6 refills | Status: AC
  Filled 2024-06-15: qty 5, 9d supply, fill #0
  Filled 2024-07-17: qty 5, 9d supply, fill #1
  Filled 2024-08-10 (×2): qty 5, 9d supply, fill #2
  Filled 2024-08-25: qty 5, 9d supply, fill #3
  Filled 2024-09-21: qty 5, 9d supply, fill #4

## 2024-06-16 ENCOUNTER — Other Ambulatory Visit: Payer: Self-pay

## 2024-07-17 ENCOUNTER — Other Ambulatory Visit: Payer: Self-pay

## 2024-07-20 ENCOUNTER — Other Ambulatory Visit: Payer: Self-pay

## 2024-08-10 ENCOUNTER — Other Ambulatory Visit: Payer: Self-pay

## 2024-08-25 ENCOUNTER — Other Ambulatory Visit: Payer: Self-pay

## 2024-08-26 ENCOUNTER — Other Ambulatory Visit: Payer: Self-pay

## 2024-09-21 ENCOUNTER — Other Ambulatory Visit: Payer: Self-pay
